# Patient Record
Sex: Female | Born: 1990 | Race: Black or African American | Hispanic: Yes | Marital: Single | Smoking: Never smoker
Health system: Southern US, Community
[De-identification: ages and names within clinical notes are randomized; demographics above are authoritative.]

## PROBLEM LIST (undated history)

## (undated) DIAGNOSIS — Z789 Other specified health status: Secondary | ICD-10-CM

## (undated) HISTORY — DX: Other specified health status: Z78.9

---

## 2017-07-25 ENCOUNTER — Other Ambulatory Visit: Payer: Self-pay

## 2017-07-25 ENCOUNTER — Encounter (HOSPITAL_COMMUNITY): Payer: Self-pay | Admitting: Emergency Medicine

## 2017-07-25 ENCOUNTER — Emergency Department (HOSPITAL_COMMUNITY)
Admission: EM | Admit: 2017-07-25 | Discharge: 2017-07-26 | Disposition: A | Discharge status: Home / Self Care | Payer: BLUE CROSS/BLUE SHIELD | Attending: Emergency Medicine | Admitting: Emergency Medicine

## 2017-07-25 DIAGNOSIS — A5901 Trichomonal vulvovaginitis: Secondary | ICD-10-CM | POA: Insufficient documentation

## 2017-07-25 DIAGNOSIS — R102 Pelvic and perineal pain: Secondary | ICD-10-CM | POA: Insufficient documentation

## 2017-07-25 DIAGNOSIS — N83202 Unspecified ovarian cyst, left side: Secondary | ICD-10-CM | POA: Diagnosis not present

## 2017-07-25 DIAGNOSIS — N946 Dysmenorrhea, unspecified: Secondary | ICD-10-CM | POA: Diagnosis not present

## 2017-07-25 DIAGNOSIS — N939 Abnormal uterine and vaginal bleeding, unspecified: Secondary | ICD-10-CM | POA: Diagnosis present

## 2017-07-25 LAB — CBC
HCT: 33.2 % — ABNORMAL LOW (ref 36.0–46.0)
Hemoglobin: 10.6 g/dL — ABNORMAL LOW (ref 12.0–15.0)
MCH: 25.3 pg — AB (ref 26.0–34.0)
MCHC: 31.9 g/dL (ref 30.0–36.0)
MCV: 79.2 fL (ref 78.0–100.0)
PLATELETS: 342 10*3/uL (ref 150–400)
RBC: 4.19 MIL/uL (ref 3.87–5.11)
RDW: 13.6 % (ref 11.5–15.5)
WBC: 4.3 10*3/uL (ref 4.0–10.5)

## 2017-07-25 LAB — I-STAT BETA HCG BLOOD, ED (MC, WL, AP ONLY)

## 2017-07-25 LAB — ABO/RH: ABO/RH(D): B POS

## 2017-07-25 LAB — HCG, QUANTITATIVE, PREGNANCY

## 2017-07-25 NOTE — ED Triage Notes (Signed)
Pt to ED for possible miscarriage - states she was due for her menstrual cycle yesterday, had initial spotting but then began having heavy blood clots. States she has gone through a 32-pack of pads since yesterday. No previous pregnancies. Had one large blood clot - showed to her GYN, who told her to go to the ED. Denies fevers, no dizziness.

## 2017-07-26 ENCOUNTER — Emergency Department (HOSPITAL_COMMUNITY): Payer: BLUE CROSS/BLUE SHIELD

## 2017-07-26 LAB — WET PREP, GENITAL
Clue Cells Wet Prep HPF POC: NONE SEEN
Sperm: NONE SEEN
WBC, Wet Prep HPF POC: NONE SEEN
Yeast Wet Prep HPF POC: NONE SEEN

## 2017-07-26 MED ORDER — NAPROXEN 500 MG PO TABS: 500.0000 mg | ORAL_TABLET | Freq: Two times a day (BID) | ORAL | 0 refills | Status: DC

## 2017-07-26 MED ORDER — METRONIDAZOLE 500 MG PO TABS: 500.0000 mg | ORAL_TABLET | Freq: Two times a day (BID) | ORAL | 0 refills | Status: DC

## 2017-07-26 MED ORDER — IBUPROFEN 400 MG PO TABS
600.0000 mg | ORAL_TABLET | Freq: Once | ORAL | Status: AC
  Administered 2017-07-26: 600 mg via ORAL
  Filled 2017-07-26: qty 1

## 2017-07-26 NOTE — ED Provider Notes (Signed)
[facility] EMERGENCY DEPARTMENT Provider Note   CSN: [id number] Arrival date & time: 07/25/17  2118     History   Chief Complaint Chief Complaint  Patient presents with  . Threatened Miscarriage    HPI Madison Jones is a 27 y.o. female with hx of anemia who presents to the ED for vaginal bleeding. Patient reports that she was dur for her period yesterday and only had spotting but then began bleeding heavy and passing clots today. She reports changing her pad 32 times since yesterday. Patient has never been pregnant before.  Patient came to the ED to see if she is having a miscarriage. Patient reports cramping in the lower abdomen. She denies fever or other problems.   HPI  History reviewed. No pertinent past medical history.  There are no active problems to display for this patient.   History reviewed. No pertinent surgical history.  OB History    Gravida Para Term Preterm AB Living   0 0 0 0 0 0   SAB TAB Ectopic Multiple Live Births   0 0 0 0 0       Home Medications    Prior to Admission medications   Not on File    Family History No family history on file.  Social History Social History   Tobacco Use  . Smoking status: Never Smoker  . Smokeless tobacco: Never Used  Substance Use Topics  . Alcohol use: Yes    Comment: occ  . Drug use: No     Allergies   Patient has no known allergies.   Review of Systems Review of Systems  Constitutional: Negative for chills and fever.  HENT: Negative.   Respiratory: Negative for shortness of breath.   Cardiovascular: Negative for chest pain.  Gastrointestinal: Positive for abdominal pain.  Genitourinary: Positive for menstrual problem and vaginal bleeding. Negative for dysuria, frequency and vaginal pain.  Musculoskeletal: Negative for back pain.  Skin: Negative for rash.  Neurological: Negative for weakness and headaches.  Psychiatric/Behavioral: Negative for confusion.      Physical Exam Updated Vital Signs BP 120/68 (BP Location: Right Arm)   Pulse (!) 105   Temp 98.6 F (37 C) (Oral)   Resp 18   Ht 5\' 2"  (1.575 m)   Wt 62.1 kg (137 lb)   LMP 07/24/2017 (Exact Date)   SpO2 100%   BMI 25.06 kg/m   Physical Exam  Constitutional: She is oriented to person, place, and time. She appears well-developed and well-nourished. No distress.  Eyes: EOM are normal.  Neck: Neck supple.  Cardiovascular: Tachycardia present.  Pulmonary/Chest: Effort normal.  Abdominal: Soft. There is tenderness.  Tender with palpation of lower abdomen. No guarding or rebound.  Genitourinary:  Genitourinary Comments: External genitalia without lesions, moderate blood vaginal vault. Frothy discharge, positive CMT, bilateral adnexal tenderness, uterus without enlargement.   Musculoskeletal: Normal range of motion.  Neurological: She is alert and oriented to person, place, and time.  Skin: Skin is warm and dry.  Psychiatric: She has a normal mood and affect.  Nursing note and vitals reviewed.    ED Treatments / Results  Labs (all labs ordered are listed, but only abnormal results are displayed) Labs Reviewed  WET PREP, GENITAL - Abnormal; Notable for the following components:      Result Value   Trich, Wet Prep PRESENT (*)    All other components within normal limits  CBC - Abnormal; Notable for the following components:   Hemoglobin  10.6 (*)    HCT 33.2 (*)    MCH 25.3 (*)    All other components within normal limits  HCG, QUANTITATIVE, PREGNANCY  I-STAT BETA HCG BLOOD, ED ([facility], [facility], [facility] ONLY)  ABO/RH  GC/CHLAMYDIA PROBE AMP ([place]) NOT AT [facility]    Radiology [place] Pelvic Complete W Transvaginal And Torsion R/o  Result Date: 07/26/2017 CLINICAL DATA:  Acute onset of pelvic pain and heavy vaginal bleeding. EXAM: TRANSABDOMINAL AND TRANSVAGINAL ULTRASOUND OF PELVIS TECHNIQUE: Both transabdominal and transvaginal ultrasound examinations of the pelvis were  performed. Transabdominal technique was performed for global imaging of the pelvis including uterus, ovaries, adnexal regions, and pelvic cul-de-sac. It was necessary to proceed with endovaginal exam following the transabdominal exam to visualize the uterus and ovaries in greater detail. COMPARISON:  None. FINDINGS: Uterus Measurements: 6.3 x 3.7 x 4.4 cm. No fibroids or other mass visualized. Endometrium Thickness: 0.5 cm.  No focal abnormality visualized. Right ovary Measurements: 2.8 x 1.6 x 2.8 cm. Normal appearance/no adnexal mass. Left ovary Measurements: 3.8 x 2.0 x 2.5 cm. A mildly complex 1.9 cm cyst at the left ovary demonstrates organization of internal echoes, suggestive of an organizing hemorrhagic cyst. Doppler evaluation of both ovaries demonstrates normal arterial and venous spectral waveforms with respect to both ovaries. Other findings Trace free fluid is seen within the pelvis. IMPRESSION: 1. Uterus unremarkable in appearance. No evidence for ovarian torsion. 2. Apparent small 1.9 cm organizing hemorrhagic cyst at the left ovary. Electronically Signed   By: Roanna RaiderJeffery  Chang M.D.   On: 07/26/2017 01:22    Procedures Procedures (including critical care time)  Medications Ordered in ED Medications - No data to display   Initial Impression / Assessment and Plan / ED Course  I have reviewed the triage vital signs and the nursing notes. 27 y.o. female here with heavy vaginal bleeding and lower abdominal pain stable for d/c without hemorrhage and no torsion noted on ultrasound. Patient does have a hemorrhagic cyst that measures 1.9 cm. And she does have trichomonas infection. Discussed results of labs and ultrasound with the patient and need for f/u and for partner treatment. Patient agrees with plan.   Final Clinical Impressions(s) / ED Diagnoses   Final diagnoses:  Pelvic pain  Trichomonas vaginitis    ED Discharge Orders    None       Kerrie Buffaloeese, Hope [place], [place] 07/26/17 0149     Doug SouJacubowitz, Sam, MD 07/28/17 1451

## 2017-07-26 NOTE — Discharge Instructions (Signed)
Follow up with your doctor for your cyst. The cyst is on the left ovary and measures 1.9 cm. Take the medication as directed for pain and inflammation. If you have additional GYN problems or your symptoms worsen [facility].

## 2017-07-26 NOTE — ED Notes (Signed)
Patient transported to Ultrasound 

## 2017-07-28 LAB — GC/CHLAMYDIA PROBE AMP (~~LOC~~) NOT AT ARMC
Chlamydia: NEGATIVE
NEISSERIA GONORRHEA: NEGATIVE

## 2019-06-04 IMAGING — US US PELV - US TRANSVAGINAL
1 series · 13 of 25 positions shown · non-contrast
Comparison: None.

CLINICAL DATA: Acute onset of pelvic pain and heavy vaginal
bleeding.

EXAM:
TRANSABDOMINAL AND TRANSVAGINAL ULTRASOUND OF PELVIS
TECHNIQUE: Both transabdominal and transvaginal ultrasound examinations of the
pelvis were performed. Transabdominal technique was performed for
global imaging of the pelvis including uterus, ovaries, adnexal
regions, and pelvic cul-de-sac. It was necessary to proceed with
endovaginal exam following the transabdominal exam to visualize the
uterus and ovaries in greater detail.

[Series 1: us pelv - us transvaginal · 0.21mm/px · 64 acquisitions, 13 frames shown]
[im 1/64]
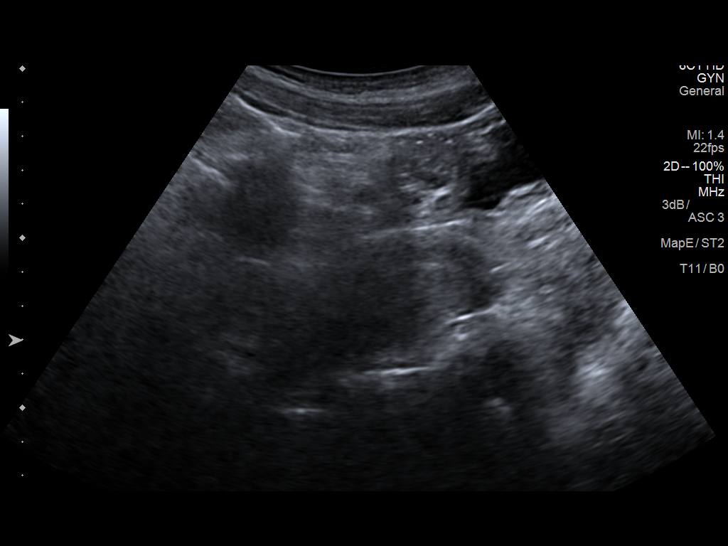
[im 6/64]
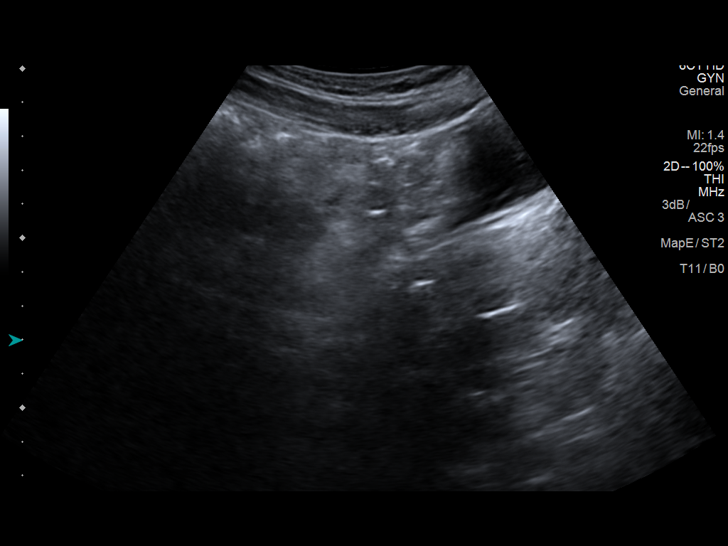
[im 11/64]
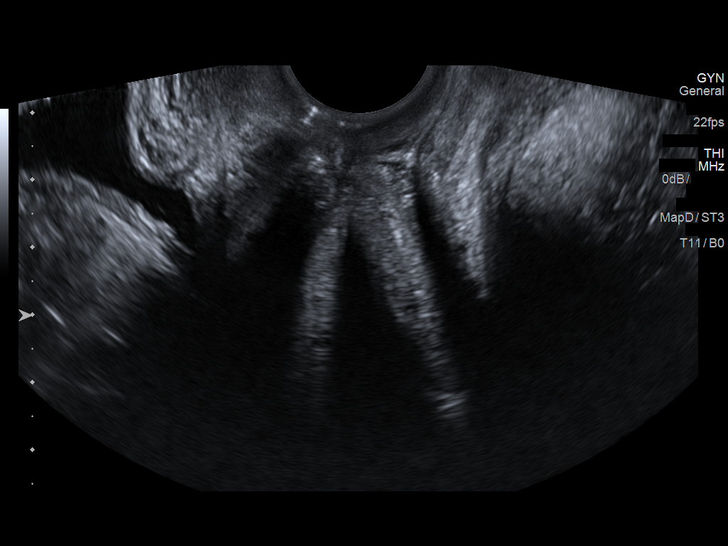
[im 16/64]
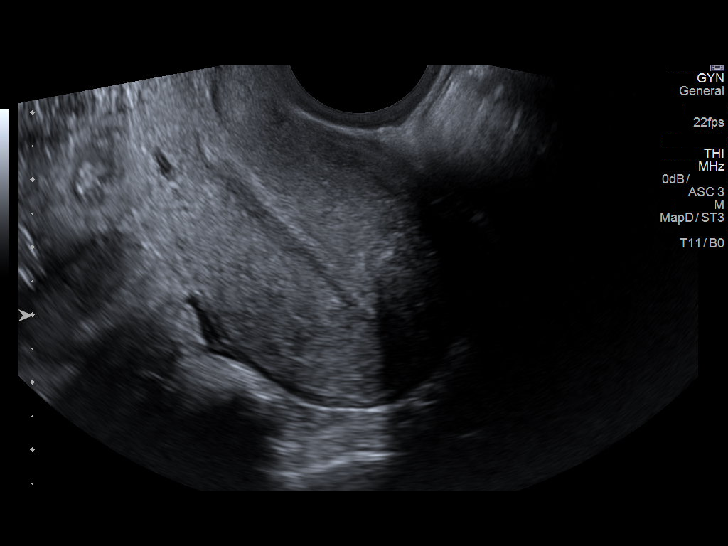
[im 22/64]
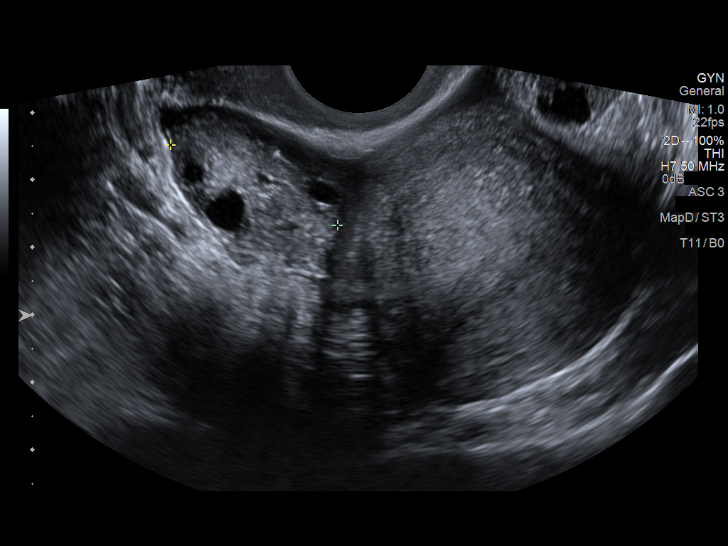
[im 27/64]
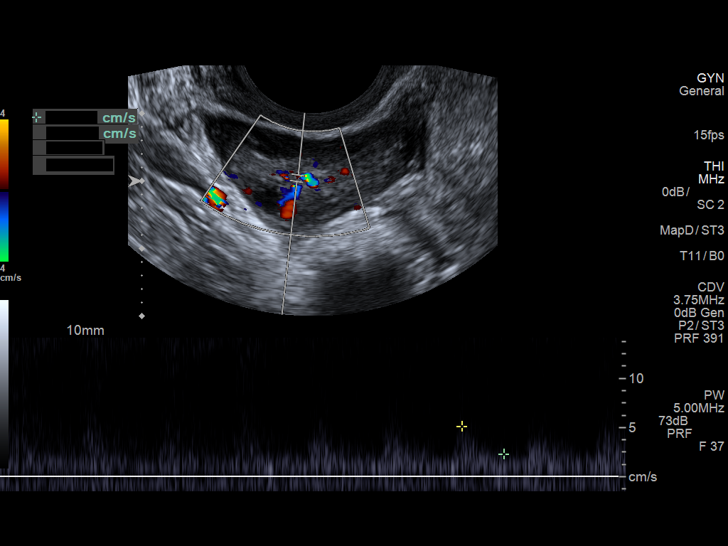
[im 32/64]
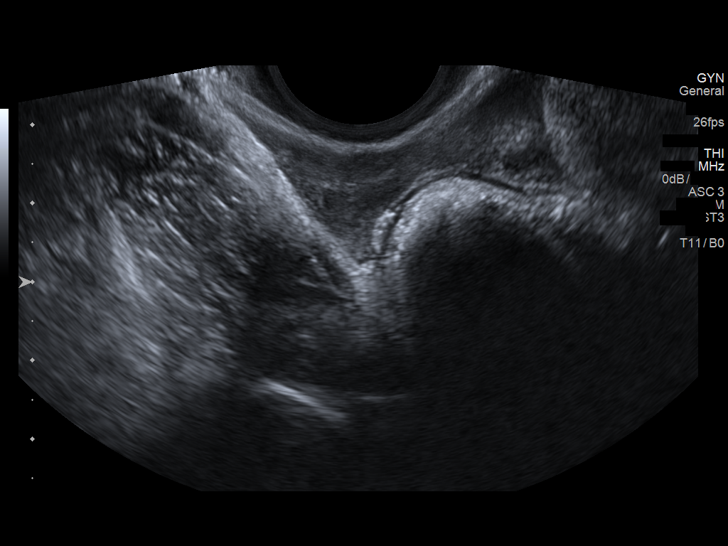
[im 37/64]
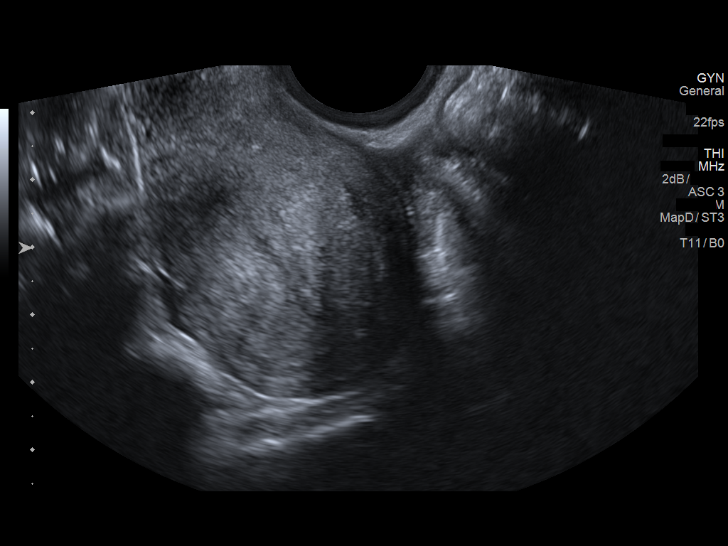
[im 43/64]
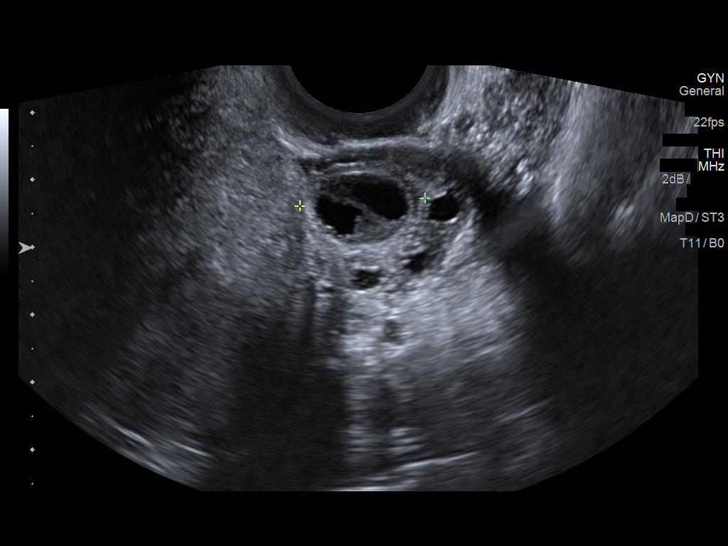
[im 48/64]
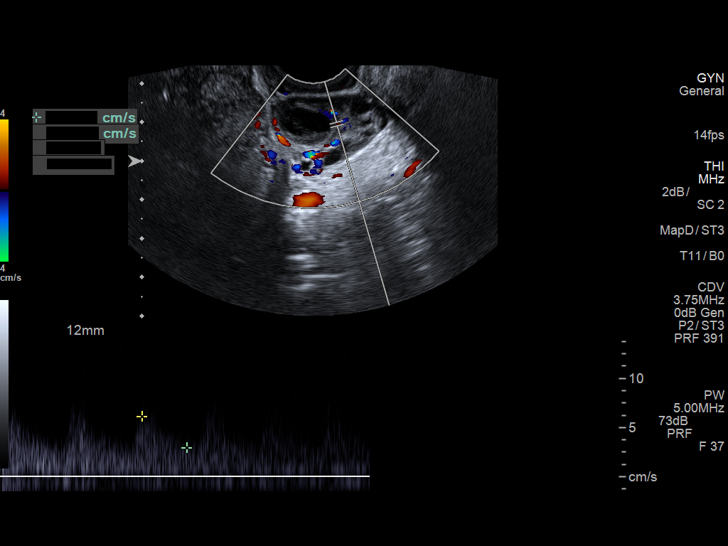
[im 53/64]
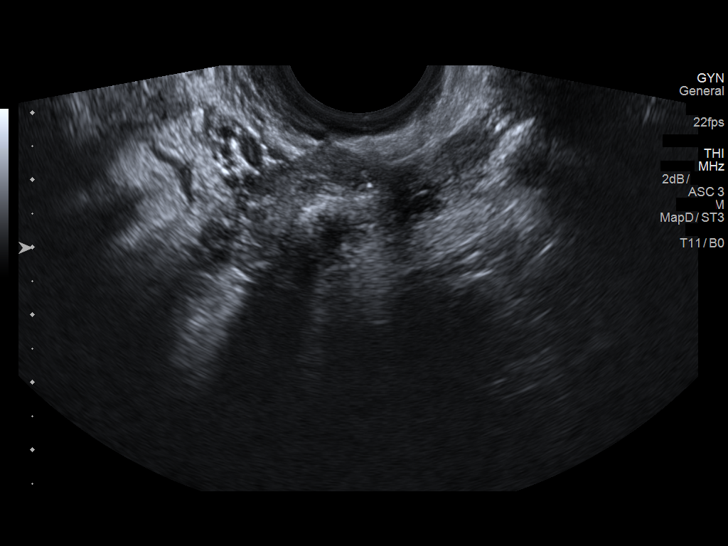
[im 58/64]
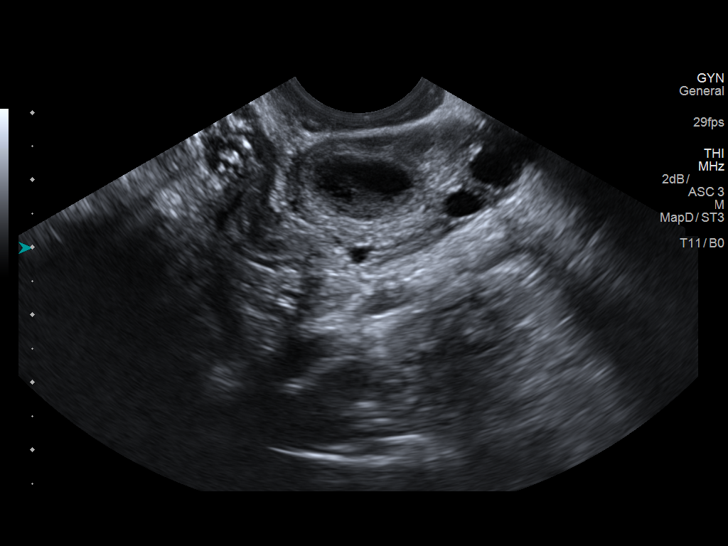
[im 64/64]
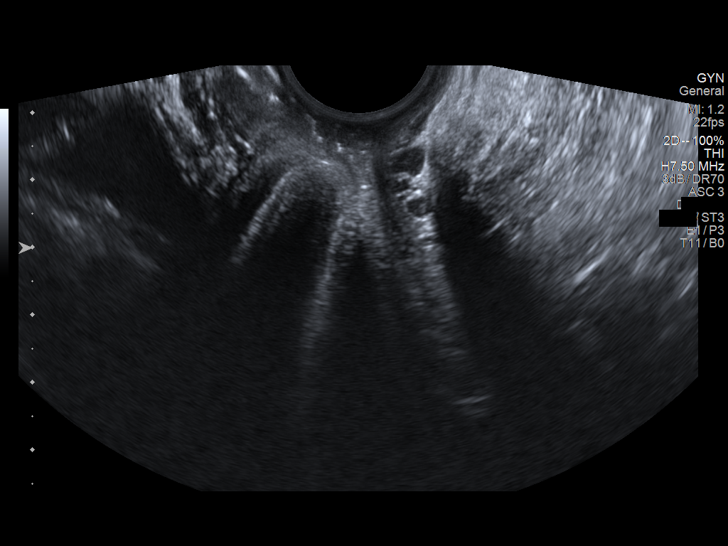

[13 of 25 positions shown; findings below may reference images not displayed]

FINDINGS: Uterus

Measurements: 6.3 x 3.7 x 4.4 cm. No fibroids or other mass
visualized.

Endometrium

Thickness: 0.5 cm.  No focal abnormality visualized.

Right ovary

Measurements: 2.8 x 1.6 x 2.8 cm. Normal appearance/no adnexal mass.

Left ovary

Measurements: 3.8 x 2.0 x 2.5 cm. A mildly complex 1.9 cm cyst at
the left ovary demonstrates organization of internal echoes,
suggestive of an organizing hemorrhagic cyst.

Doppler evaluation of both ovaries demonstrates normal arterial and
venous spectral waveforms with respect to both ovaries.

Other findings

Trace free fluid is seen within the pelvis.
IMPRESSION: 1. Uterus unremarkable in appearance. No evidence for ovarian
torsion.
2. Apparent small 1.9 cm organizing hemorrhagic cyst at the left
ovary.

## 2020-06-06 ENCOUNTER — Other Ambulatory Visit: Payer: Self-pay

## 2020-06-07 ENCOUNTER — Encounter: Payer: Self-pay | Admitting: Family Medicine

## 2020-06-07 ENCOUNTER — Ambulatory Visit: Payer: BC Managed Care – PPO | Admitting: Family Medicine

## 2020-06-07 ENCOUNTER — Other Ambulatory Visit (HOSPITAL_COMMUNITY)
Admission: RE | Admit: 2020-06-07 | Discharge: 2020-06-07 | Disposition: A | Discharge status: Home / Self Care | Payer: BC Managed Care – PPO | Source: Ambulatory Visit | Attending: Family Medicine | Admitting: Family Medicine

## 2020-06-07 VITALS — BP 111/71 | HR 71 | Temp 98.5°F | Ht 62.0 in | Wt 138.0 lb

## 2020-06-07 DIAGNOSIS — Z131 Encounter for screening for diabetes mellitus: Secondary | ICD-10-CM

## 2020-06-07 DIAGNOSIS — Z13220 Encounter for screening for lipoid disorders: Secondary | ICD-10-CM

## 2020-06-07 DIAGNOSIS — Z113 Encounter for screening for infections with a predominantly sexual mode of transmission: Secondary | ICD-10-CM | POA: Diagnosis not present

## 2020-06-07 DIAGNOSIS — Z23 Encounter for immunization: Secondary | ICD-10-CM

## 2020-06-07 DIAGNOSIS — Z130 Encounter for screening for diseases of the blood and blood-forming organs and certain disorders involving the immune mechanism: Secondary | ICD-10-CM | POA: Diagnosis not present

## 2020-06-07 DIAGNOSIS — Z114 Encounter for screening for human immunodeficiency virus [HIV]: Secondary | ICD-10-CM

## 2020-06-07 DIAGNOSIS — Z0000 Encounter for general adult medical examination without abnormal findings: Secondary | ICD-10-CM | POA: Diagnosis not present

## 2020-06-07 DIAGNOSIS — Z1159 Encounter for screening for other viral diseases: Secondary | ICD-10-CM

## 2020-06-07 DIAGNOSIS — Z7689 Persons encountering health services in other specified circumstances: Secondary | ICD-10-CM

## 2020-06-07 LAB — COMPREHENSIVE METABOLIC PANEL
ALT: 8 U/L (ref 0–35)
AST: 16 U/L (ref 0–37)
Albumin: 4.1 g/dL (ref 3.5–5.2)
Alkaline Phosphatase: 42 U/L (ref 39–117)
BUN: 7 mg/dL (ref 6–23)
CO2: 27 mEq/L (ref 19–32)
Calcium: 9.2 mg/dL (ref 8.4–10.5)
Chloride: 102 mEq/L (ref 96–112)
Creatinine, Ser: 0.75 mg/dL (ref 0.40–1.20)
GFR: 107.78 mL/min (ref >60.00)
Glucose, Bld: 73 mg/dL (ref 70–99)
Potassium: 3.8 mEq/L (ref 3.5–5.1)
Sodium: 135 mEq/L (ref 135–145)
Total Bilirubin: 0.7 mg/dL (ref 0.2–1.2)
Total Protein: 7.4 g/dL (ref 6.0–8.3)

## 2020-06-07 LAB — CBC WITH DIFFERENTIAL/PLATELET
Basophils Absolute: 0 10*3/uL (ref 0.0–0.1)
Basophils Relative: 1.1 % (ref 0.0–3.0)
Eosinophils Absolute: 0.1 10*3/uL (ref 0.0–0.7)
Eosinophils Relative: 1.4 % (ref 0.0–5.0)
HCT: 37.1 % (ref 36.0–46.0)
Hemoglobin: 12.1 g/dL (ref 12.0–15.0)
Lymphocytes Relative: 24.3 % (ref 12.0–46.0)
Lymphs Abs: 1 10*3/uL (ref 0.7–4.0)
MCHC: 32.7 g/dL (ref 30.0–36.0)
MCV: 80.5 fl (ref 78.0–100.0)
Monocytes Absolute: 0.2 10*3/uL (ref 0.1–1.0)
Monocytes Relative: 4.6 % (ref 3.0–12.0)
Neutro Abs: 2.9 10*3/uL (ref 1.4–7.7)
Neutrophils Relative %: 68.6 % (ref 43.0–77.0)
Platelets: 366 10*3/uL (ref 150.0–400.0)
RBC: 4.61 Mil/uL (ref 3.87–5.11)
RDW: 14.1 % (ref 11.5–15.5)
WBC: 4.2 10*3/uL (ref 4.0–10.5)

## 2020-06-07 LAB — LIPID PANEL
Cholesterol: 128 mg/dL (ref 0–200)
HDL: 61.8 mg/dL (ref >39.00)
LDL Cholesterol: 55 mg/dL (ref 0–99)
NonHDL: 66.63
Total CHOL/HDL Ratio: 2
Triglycerides: 60 mg/dL (ref 0.0–149.0)
VLDL: 12 mg/dL (ref 0.0–40.0)

## 2020-06-07 LAB — HEMOGLOBIN A1C: Hgb A1c MFr Bld: 5 % (ref 4.6–6.5)

## 2020-06-07 NOTE — Patient Instructions (Signed)
Health Maintenance, Female Adopting a healthy lifestyle and getting preventive care are important in promoting health and wellness. Ask your health care provider about:  The right schedule for you to have regular tests and exams.  Things you can do on your own to prevent diseases and keep yourself healthy. What should I know about diet, weight, and exercise? Eat a healthy diet  Eat a diet that includes plenty of vegetables, fruits, low-fat dairy products, and lean protein.  Do not eat a lot of foods that are high in solid fats, added sugars, or sodium.   Maintain a healthy weight Body mass index (BMI) is used to identify weight problems. It estimates body fat based on height and weight. Your health care provider can help determine your BMI and help you achieve or maintain a healthy weight. Get regular exercise Get regular exercise. This is one of the most important things you can do for your health. Most adults should:  Exercise for at least 150 minutes each week. The exercise should increase your heart rate and make you sweat (moderate-intensity exercise).  Do strengthening exercises at least twice a week. This is in addition to the moderate-intensity exercise.  Spend less time sitting. Even light physical activity can be beneficial. Watch cholesterol and blood lipids Have your blood tested for lipids and cholesterol at 30 years of age, then have this test every 5 years. Have your cholesterol levels checked more often if:  Your lipid or cholesterol levels are high.  You are older than 30 years of age.  You are at high risk for heart disease. What should I know about cancer screening? Depending on your health history and family history, you may need to have cancer screening at various ages. This may include screening for:  Breast cancer.  Cervical cancer.  Colorectal cancer.  Skin cancer.  Lung cancer. What should I know about heart disease, diabetes, and high blood  pressure? Blood pressure and heart disease  High blood pressure causes heart disease and increases the risk of stroke. This is more likely to develop in people who have high blood pressure readings, are of African descent, or are overweight.  Have your blood pressure checked: ? Every 3-5 years if you are 18-39 years of age. ? Every year if you are 40 years old or older. Diabetes Have regular diabetes screenings. This checks your fasting blood sugar level. Have the screening done:  Once every three years after age 40 if you are at a normal weight and have a low risk for diabetes.  More often and at a younger age if you are overweight or have a high risk for diabetes. What should I know about preventing infection? Hepatitis B If you have a higher risk for hepatitis B, you should be screened for this virus. Talk with your health care provider to find out if you are at risk for hepatitis B infection. Hepatitis C Testing is recommended for:  Everyone born from 1945 through 1965.  Anyone with known risk factors for hepatitis C. Sexually transmitted infections (STIs)  Get screened for STIs, including gonorrhea and chlamydia, if: ? You are sexually active and are younger than 30 years of age. ? You are older than 30 years of age and your health care provider tells you that you are at risk for this type of infection. ? Your sexual activity has changed since you were last screened, and you are at increased risk for chlamydia or gonorrhea. Ask your health care provider   if you are at risk.  Ask your health care provider about whether you are at high risk for HIV. Your health care provider may recommend a prescription medicine to help prevent HIV infection. If you choose to take medicine to prevent HIV, you should first get tested for HIV. You should then be tested every 3 months for as long as you are taking the medicine. Pregnancy  If you are about to stop having your period (premenopausal) and  you may become pregnant, seek counseling before you get pregnant.  Take 400 to 800 micrograms (mcg) of folic acid every day if you become pregnant.  Ask for birth control (contraception) if you want to prevent pregnancy. Osteoporosis and menopause Osteoporosis is a disease in which the bones lose minerals and strength with aging. This can result in bone fractures. If you are 65 years old or older, or if you are at risk for osteoporosis and fractures, ask your health care provider if you should:  Be screened for bone loss.  Take a calcium or vitamin D supplement to lower your risk of fractures.  Be given hormone replacement therapy (HRT) to treat symptoms of menopause. Follow these instructions at home: Lifestyle  Do not use any products that contain nicotine or tobacco, such as cigarettes, e-cigarettes, and chewing tobacco. If you need help quitting, ask your health care provider.  Do not use street drugs.  Do not share needles.  Ask your health care provider for help if you need support or information about quitting drugs. Alcohol use  Do not drink alcohol if: ? Your health care provider tells you not to drink. ? You are pregnant, may be pregnant, or are planning to become pregnant.  If you drink alcohol: ? Limit how much you use to 0-1 drink a day. ? Limit intake if you are breastfeeding.  Be aware of how much alcohol is in your drink. In the [place] one drink equals one 12 oz bottle of beer (355 mL), one 5 oz glass of wine (148 mL), or one 1 oz glass of hard liquor (44 mL). General instructions  Schedule regular health, dental, and eye exams.  Stay current with your vaccines.  Tell your health care provider if: ? You often feel depressed. ? You have ever been abused or do not feel safe at home. Summary  Adopting a healthy lifestyle and getting preventive care are important in promoting health and wellness.  Follow your health care provider's instructions about healthy  diet, exercising, and getting tested or screened for diseases.  Follow your health care provider's instructions on monitoring your cholesterol and blood pressure. This information is not intended to replace advice given to you by your health care provider. Make sure you discuss any questions you have with your health care provider. Document Revised: 04/22/2018 Document Reviewed: 04/22/2018 Elsevier Patient Education  2021 Elsevier Inc.  

## 2020-06-07 NOTE — Progress Notes (Signed)
Patient ID: Madison Jones, female  DOB: [birth year], 30 y.o.   MRN: [id number] Patient Care Team    Relationship Specialty Notifications Start End  Natalia Leatherwood, DO PCP - General Family Medicine  06/07/20     Chief Complaint  Patient presents with  . Annual Exam    CPE;     Subjective: Madison Jones is a 30 y.o.  female present for new patient establishment/CPE. All past medical history, surgical history, allergies, family history, immunizations, medications and social history were updated in the electronic medical record today. All recent labs, ED visits and hospitalizations within the last year were reviewed.  Health maintenance:  Colonoscopy:no fhx. Screen at 45 . Mammogram: no fhx- screen at 40 Cervical cancer screening: last pap: pt reports 2019, results: ?, completed by:PCP- wants to have here when due Immunizations: tdap UTD 2016, Influenza updated (encouraged yearly), covid series completed- booster- scheduling at work.  Infectious disease screening: HIV and Hep C and G/C cytology collected today per pt preference.  DEXA:routine screen at 60 Assistive device: none Oxygen IRW:ERXV Patient has a Dental home. Hospitalizations/ED visits:reviewed  Pt reports regular menstrual cycles- 28 days, with 5 days of light bleeding. She has unprotected sex with her female partner and would like STD screen today. She is not on any form of birth control.   Depression screen [facility] 2/9 06/07/2020  Decreased Interest 0  Down, Depressed, Hopeless 0  PHQ - 2 Score 0   No flowsheet data found.      No flowsheet data found.  Immunization History  Administered Date(s) Administered  . Hepatitis B 02/09/2002, 03/10/2002, 08/09/2002, 12/08/2014  . IPV 06/11/1991, 08/24/1991, 10/15/1991, 10/13/1992  . Influenza,inj,Quad PF,6+ Mos 06/07/2020  . PFIZER(Purple Top)SARS-COV-2 Vaccination 08/16/2019, 09/06/2019  . Td 07/13/2009  . Tdap 12/08/2014    No exam data present  History  reviewed. No pertinent past medical history. No Known Allergies History reviewed. No pertinent surgical history. Family History  Problem Relation Age of Onset  . Arthritis Mother   . Depression Mother   . Hypertension Mother   . Arthritis Father   . Depression Father   . Heart attack Father   . Hypertension Father   . Rheum arthritis Father   . Depression Sister   . Asthma Sister   . Learning disabilities Sister   . Arthritis Maternal Grandmother   . Asthma Maternal Grandmother   . Arthritis Maternal Grandfather   . Asthma Maternal Grandfather   . Skin cancer Maternal Grandfather   . Stroke Maternal Grandfather   . Rheum arthritis Paternal Grandmother   . Arthritis Paternal Grandmother   . Asthma Paternal Grandmother   . Cancer Paternal Grandfather   . Heart disease Sister    Social History   Social History Narrative   Marital status/children/pets: single   Education/employment: HS grad, HR- [employer]   Safety:      -Wears a bicycle helmet riding a bike: Yes     -smoke alarm in the home:Yes     - wears seatbelt: Yes     - Feels safe in their relationships: Yes    Allergies as of 06/07/2020   No Known Allergies     Medication List       Accurate as of June 07, 2020 11:29 AM. If you have any questions, ask your nurse or doctor.        STOP taking these medications   metroNIDAZOLE 500 MG tablet Commonly known as: FLAGYL Stopped by: Luster Landsberg  Ariona Deschene, DO   naproxen 500 MG tablet Commonly known as: NAPROSYN Stopped by: Felix Pacini, DO       All past medical history, surgical history, allergies, family history, immunizations andmedications were updated in the EMR today and reviewed under the history and medication portions of their EMR.    No results found for this or any previous visit (from the past 2160 hour(s)).    ROS: 14 pt review of systems performed and negative (unless mentioned in an HPI)  Objective: BP 111/71   Pulse 71   Temp 98.5 F (36.9  C) (Oral)   Ht 5\' 2"  (1.575 m)   Wt 138 lb (62.6 kg)   LMP 05/19/2020   SpO2 100%   BMI 25.24 kg/m  Gen: Afebrile. No acute distress. Nontoxic in appearance, well-developed, well-nourished,  Very pleasant female.  HENT: AT. [place]. Bilateral TM visualized and normal in appearance, normal external auditory canal. MMM, no oral lesions, adequate dentition. Bilateral nares within normal limits. Throat without erythema, ulcerations or exudates. no Cough on exam, no hoarseness on exam. Eyes:Pupils Equal Round Reactive to light, Extraocular movements intact,  Conjunctiva without redness, discharge or icterus. Neck/lymp/endocrine: Supple,no lymphadenopathy, no thyromegaly CV: RRR no murmur, no edema, +2/4 P posterior tibialis pulses.  Chest: CTAB, no wheeze, rhonchi or crackles. normal Respiratory effort. good Air movement. Abd: Soft. flat. NTND. BS present. no Masses palpated. No hepatosplenomegaly. No rebound tenderness or guarding. Skin: no rashes, purpura or petechiae. Warm and well-perfused. Skin intact. Neuro/Msk:  Normal gait. PERLA. EOMi. Alert. Oriented x3.  Cranial nerves II through XII intact. Muscle strength 5/5 upper/lower extremity. DTRs equal bilaterally. Psych: Normal affect, dress and demeanor. Normal speech. Normal thought content and judgment.   Assessment/plan: Madison Jones is a 30 y.o. female present for est care/CPE Establishing care with new doctor, encounter for Lipid screening - Lipid panel  Encounter for screening for HIV - HIV antibody (with reflex) Need for hepatitis C screening test - Hepatitis C Antibody  Diabetes mellitus screening - Comprehensive metabolic panel - Hemoglobin A1c  Screening for deficiency anemia - CBC with Differential/Platelet  Influenza vaccine needed - Flu Vaccine QUAD 6+ mos PF IM (Fluarix Quad PF)  Screen for STD (sexually transmitted disease) Asymptomatic- desired screen Encouraged condom use. - Urine cytology ancillary only([facility])  Routine general medical examination at a health care facility Patient was encouraged to exercise greater than 150 minutes a week. Patient was encouraged to choose a diet filled with fresh fruits and vegetables, and lean meats. AVS provided to patient today for education/recommendation on gender specific health and safety maintenance. Colonoscopy:no fhx. Screen at 45 . Mammogram: no fhx- screen at 40 Cervical cancer screening: last pap: pt reports 2019, results: ?, completed by:PCP- wants to have here when due Immunizations: tdap UTD 2016, Influenza updated (encouraged yearly), covid series completed- booster- scheduling at work.  Infectious disease screening: HIV and Hep C and G/C cytology collected today per pt preference.   Return in about 1 year (around 06/07/2021) for CPE (30 min).  Orders Placed This Encounter  Procedures  . Flu Vaccine QUAD 6+ mos PF IM (Fluarix Quad PF)  . CBC with Differential/Platelet  . Comprehensive metabolic panel  . Hemoglobin A1c  . Lipid panel  . HIV antibody (with reflex)  . Hepatitis C Antibody   No orders of the defined types were placed in this encounter.  Referral Orders  No referral(s) requested today     Note is dictated utilizing voice recognition  software. Although note has been proof read prior to signing, occasional typographical errors still can be missed. If any questions arise, please do not hesitate to call for verification.  Electronically signed by: Howard Pouch, DO [place]

## 2020-06-08 LAB — HEPATITIS C ANTIBODY
Hepatitis C Ab: NONREACTIVE
SIGNAL TO CUT-OFF: 0.02 (ref <1.00)

## 2020-06-08 LAB — URINE CYTOLOGY ANCILLARY ONLY
Chlamydia: NEGATIVE
Comment: NEGATIVE
Comment: NORMAL
Neisseria Gonorrhea: NEGATIVE

## 2020-06-08 LAB — HIV ANTIBODY (ROUTINE TESTING W REFLEX): HIV 1&2 Ab, 4th Generation: NONREACTIVE

## 2021-01-30 ENCOUNTER — Ambulatory Visit: Payer: BC Managed Care – PPO | Admitting: Family Medicine

## 2021-07-30 ENCOUNTER — Ambulatory Visit (INDEPENDENT_AMBULATORY_CARE_PROVIDER_SITE_OTHER): Payer: BC Managed Care – PPO | Admitting: Family Medicine

## 2021-07-30 ENCOUNTER — Encounter: Payer: Self-pay | Admitting: Family Medicine

## 2021-07-30 ENCOUNTER — Other Ambulatory Visit: Payer: Self-pay

## 2021-07-30 VITALS — BP 130/66 | HR 71 | Temp 98.3°F | Ht 62.25 in | Wt 154.0 lb

## 2021-07-30 DIAGNOSIS — Z0000 Encounter for general adult medical examination without abnormal findings: Secondary | ICD-10-CM

## 2021-07-30 DIAGNOSIS — N926 Irregular menstruation, unspecified: Secondary | ICD-10-CM | POA: Diagnosis not present

## 2021-07-30 DIAGNOSIS — Z130 Encounter for screening for diseases of the blood and blood-forming organs and certain disorders involving the immune mechanism: Secondary | ICD-10-CM

## 2021-07-30 DIAGNOSIS — Z131 Encounter for screening for diabetes mellitus: Secondary | ICD-10-CM

## 2021-07-30 DIAGNOSIS — Z13220 Encounter for screening for lipoid disorders: Secondary | ICD-10-CM

## 2021-07-30 LAB — CBC
HCT: 36.7 % (ref 36.0–46.0)
Hemoglobin: 12.1 g/dL (ref 12.0–15.0)
MCHC: 32.8 g/dL (ref 30.0–36.0)
MCV: 81.6 fl (ref 78.0–100.0)
Platelets: 312 10*3/uL (ref 150.0–400.0)
RBC: 4.5 Mil/uL (ref 3.87–5.11)
RDW: 15 % (ref 11.5–15.5)
WBC: 3.5 10*3/uL — ABNORMAL LOW (ref 4.0–10.5)

## 2021-07-30 LAB — COMPREHENSIVE METABOLIC PANEL
ALT: 9 U/L (ref 0–35)
AST: 17 U/L (ref 0–37)
Albumin: 3.7 g/dL (ref 3.5–5.2)
Alkaline Phosphatase: 39 U/L (ref 39–117)
BUN: 5 mg/dL — ABNORMAL LOW (ref 6–23)
CO2: 24 mEq/L (ref 19–32)
Calcium: 8.5 mg/dL (ref 8.4–10.5)
Chloride: 104 mEq/L (ref 96–112)
Creatinine, Ser: 0.71 mg/dL (ref 0.40–1.20)
GFR: 114.19 mL/min (ref >60.00)
Glucose, Bld: 83 mg/dL (ref 70–99)
Potassium: 3.9 mEq/L (ref 3.5–5.1)
Sodium: 134 mEq/L — ABNORMAL LOW (ref 135–145)
Total Bilirubin: 0.3 mg/dL (ref 0.2–1.2)
Total Protein: 6.5 g/dL (ref 6.0–8.3)

## 2021-07-30 LAB — HEMOGLOBIN A1C: Hgb A1c MFr Bld: 5.1 % (ref 4.6–6.5)

## 2021-07-30 LAB — LIPID PANEL
Cholesterol: 116 mg/dL (ref 0–200)
HDL: 56.8 mg/dL (ref >39.00)
LDL Cholesterol: 42 mg/dL (ref 0–99)
NonHDL: 59.01
Total CHOL/HDL Ratio: 2
Triglycerides: 86 mg/dL (ref 0.0–149.0)
VLDL: 17.2 mg/dL (ref 0.0–40.0)

## 2021-07-30 LAB — TSH: TSH: 2.74 u[IU]/mL (ref 0.35–5.50)

## 2021-07-30 NOTE — Patient Instructions (Signed)

## 2021-07-30 NOTE — Progress Notes (Signed)
? ?This visit occurred during the SARS-CoV-2 public health emergency.  Safety protocols were in place, including screening questions prior to the visit, additional usage of staff PPE, and extensive cleaning of exam room while observing appropriate contact time as indicated for disinfecting solutions.  ? ? ?Patient ID: Madison Jones, female  DOB: [birth year], 31 y.o.   MRN: [id number] ?Patient Care Team  ?  Relationship Specialty Notifications Start End  ?Natalia Leatherwood, DO PCP - General Family Medicine  06/07/20   ? ? ?Chief Complaint  ?Patient presents with  ? Annual Exam  ?  Pt is fasting  ? ? ?Subjective: ?Madison Jones is a 31 y.o.  Female  present for CPE  ?All past medical history, surgical history, allergies, family history, immunizations, medications and social history were updated in the electronic medical record today. ?All recent labs, ED visits and hospitalizations within the last year were reviewed. ? ?Health maintenance:  ?Colonoscopy:no fhx. Screen at 45 . ?Mammogram: no fhx- screen at 40 ?Cervical cancer screening: last pap: pt reports 2019, results: ?, completed by:PCP- wants to have here when due ?Immunizations: tdap UTD 2016, Influenza updated (encouraged yearly), covid series completed- booster- scheduling at work.  ?Infectious disease screening: HIV and Hep C and G/C UTD ?DEXA:routine screen at 60 ?Patient has a Dental home. ?Hospitalizations/ED visits: reviewed ? ?Menstrual changes: pt reports normal bowel and bladder habits. Her cycle has become shorter. Use to be 7 days now about 4 and light. She is working out more in [employer] and has changed her eating habits.  ? ?Depression screen [facility] 2/9 07/30/2021 06/07/2020  ?Decreased Interest 0 0  ?Down, Depressed, Hopeless 0 0  ?PHQ - 2 Score 0 0  ? ?No flowsheet data found. ? ? ?Immunization History  ?Administered Date(s) Administered  ? Hepatitis B 02/09/2002, 03/10/2002, 08/09/2002, 12/08/2014  ? IPV 06/11/1991, 08/24/1991, 10/15/1991, 10/13/1992  ?  Influenza,inj,Quad PF,6+ Mos 06/07/2020  ? PFIZER(Purple Top)SARS-COV-2 Vaccination 08/16/2019, 09/06/2019  ? Td 07/13/2009, 01/05/2021  ? Tdap 12/08/2014  ? ? ?History reviewed. No pertinent past medical history. ?No Known Allergies ?History reviewed. No pertinent surgical history. ?Family History  ?Problem Relation Age of Onset  ? Arthritis Mother   ? Depression Mother   ? Hypertension Mother   ? Arthritis Father   ? Depression Father   ? Heart attack Father   ? Hypertension Father   ? Rheum arthritis Father   ? Depression Sister   ? Asthma Sister   ? Learning disabilities Sister   ? Arthritis Maternal Grandmother   ? Asthma Maternal Grandmother   ? Arthritis Maternal Grandfather   ? Asthma Maternal Grandfather   ? Skin cancer Maternal Grandfather   ? Stroke Maternal Grandfather   ? Rheum arthritis Paternal Grandmother   ? Arthritis Paternal Grandmother   ? Asthma Paternal Grandmother   ? Cancer Paternal Grandfather   ? Heart disease Sister   ? ?Social History  ? ?Social History Narrative  ? Marital status/children/pets: single  ? 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 Safety:   ?   -Wears a bicycle helmet riding a bike: Yes  ?   -smoke alarm in the home:Yes  ?   - wears seatbelt: Yes  ?   - Feels safe in their relationships: Yes  ? ? ?Allergies as of 07/30/2021   ?No Known Allergies ?  ? ?  ?Medication List  ?  ?as of July 30, 2021  9:24 AM ?  ?You have not been prescribed  any medications. ?  ? ? ?All past medical history, surgical history, allergies, family history, immunizations andmedications were updated in the EMR today and reviewed under the history and medication portions of their EMR.    ? ?No results found for this or any previous visit (from the past 2160 hour(s)). ? ?No results found. ? ? ?ROS ?14 pt review of systems performed and negative (unless mentioned in an HPI) ?Objective: ?BP 130/66   Pulse 71   Temp 98.3 ?F (36.8 ?C) (Oral)   Ht 5' 2.25" (1.581 m)   Wt 154 lb (69.9 kg)   LMP  07/10/2021   SpO2 100%   BMI 27.94 kg/m?  ?Physical Exam ?Vitals and nursing note reviewed.  ?Constitutional:   ?   General: She is not in acute distress. ?   Appearance: Normal appearance. She is not ill-appearing or toxic-appearing.  ?HENT:  ?   Head: Normocephalic and atraumatic.  ?   Right Ear: Tympanic membrane, ear canal and external ear normal. There is no impacted cerumen.  ?   Left Ear: Tympanic membrane, ear canal and external ear normal. There is no impacted cerumen.  ?   Nose: No congestion or rhinorrhea.  ?   Mouth/Throat:  ?   Mouth: Mucous membranes are moist.  ?   Pharynx: Oropharynx is clear. No oropharyngeal exudate or posterior oropharyngeal erythema.  ?Eyes:  ?   General: No scleral icterus.    ?   Right eye: No discharge.     ?   Left eye: No discharge.  ?   Extraocular Movements: Extraocular movements intact.  ?   Conjunctiva/sclera: Conjunctivae normal.  ?   Pupils: Pupils are equal, round, and reactive to light.  ?Cardiovascular:  ?   Rate and Rhythm: Normal rate and regular rhythm.  ?   Pulses: Normal pulses.  ?   Heart sounds: Normal heart sounds. No murmur heard. ?  No friction rub. No gallop.  ?Pulmonary:  ?   Effort: Pulmonary effort is normal. No respiratory distress.  ?   Breath sounds: Normal breath sounds. No stridor. No wheezing, rhonchi or rales.  ?Chest:  ?   Chest wall: No tenderness.  ?Abdominal:  ?   General: Abdomen is flat. Bowel sounds are normal. There is no distension.  ?   Palpations: Abdomen is soft. There is no mass.  ?   Tenderness: There is no abdominal tenderness. There is no right CVA tenderness, left CVA tenderness, guarding or rebound.  ?   Hernia: No hernia is present.  ?Musculoskeletal:     ?   General: No swelling, tenderness or deformity. Normal range of motion.  ?   Cervical back: Normal range of motion and neck supple. No rigidity or tenderness.  ?   Right lower leg: No edema.  ?   Left lower leg: No edema.  ?Lymphadenopathy:  ?   Cervical: No cervical  adenopathy.  ?Skin: ?   General: Skin is warm and dry.  ?   Coloration: Skin is not jaundiced or pale.  ?   Findings: No bruising, erythema, lesion or rash.  ?Neurological:  ?   General: No focal deficit present.  ?   Mental Status: She is alert and oriented to person, place, and time. Mental status is at baseline.  ?   Cranial Nerves: No cranial nerve deficit.  ?   Sensory: No sensory deficit.  ?   Motor: No weakness.  ?   Coordination: Coordination normal.  ?  Gait: Gait normal.  ?   Deep Tendon Reflexes: Reflexes normal.  ?Psychiatric:     ?   Mood and Affect: Mood normal.     ?   Behavior: Behavior normal.     ?   Thought Content: Thought content normal.     ?   Judgment: Judgment normal.  ?  ? ?No results found. ? ?Assessment/plan: ?Madison Jones is a 31 y.o. female present for CPE  ?Screening for deficiency anemia ?- CBC ?Diabetes mellitus screening ?- Comprehensive metabolic panel ?- Hemoglobin A1c ?Lipid screening ?- Lipid panel ?Menstrual changes ?- TSH ? ?Routine general medical examination at a health care facility ?Colonoscopy:no fhx. Screen at 45 . ?Mammogram: no fhx- screen at 40 ?Cervical cancer screening: last pap: pt reports 2019, results: ?, completed by:PCP- wants to have here when due ?Immunizations: tdap UTD 2016, Influenza updated (encouraged yearly), covid series completed- booster- scheduling at work.  ?Infectious disease screening: HIV and Hep C and G/C UTD ?DEXA:routine screen at 60 ?Patient was encouraged to exercise greater than 150 minutes a week. Patient was encouraged to choose a diet filled with fresh fruits and vegetables, and lean meats. ?AVS provided to patient today for education/recommendation on gender specific health and safety maintenance. ? ?Return in about 3 months (around 10/30/2021) for Pap. ? ? ?Orders Placed This Encounter  ?Procedures  ? CBC  ? Comprehensive metabolic panel  ? Hemoglobin A1c  ? Lipid panel  ? TSH  ? ?No orders of the defined types were placed in this  encounter. ? ?Referral Orders  ?No referral(s) requested today  ? ? ? ?Electronically signed by: ?Felix Pacini, DO ?[facility] ?

## 2021-10-30 ENCOUNTER — Encounter: Payer: Self-pay | Admitting: Family Medicine

## 2021-10-30 ENCOUNTER — Ambulatory Visit: Payer: BC Managed Care – PPO | Admitting: Family Medicine

## 2021-10-30 ENCOUNTER — Other Ambulatory Visit (HOSPITAL_COMMUNITY)
Admission: RE | Admit: 2021-10-30 | Discharge: 2021-10-30 | Disposition: A | Discharge status: Home / Self Care | Payer: BC Managed Care – PPO | Source: Ambulatory Visit | Attending: Family Medicine | Admitting: Family Medicine

## 2021-10-30 VITALS — BP 120/69 | HR 73 | Temp 98.5°F | Ht 62.0 in | Wt 150.0 lb

## 2021-10-30 DIAGNOSIS — Z113 Encounter for screening for infections with a predominantly sexual mode of transmission: Secondary | ICD-10-CM | POA: Diagnosis not present

## 2021-10-30 DIAGNOSIS — R109 Unspecified abdominal pain: Secondary | ICD-10-CM

## 2021-10-30 DIAGNOSIS — Z01419 Encounter for gynecological examination (general) (routine) without abnormal findings: Secondary | ICD-10-CM | POA: Insufficient documentation

## 2021-10-30 NOTE — Progress Notes (Signed)
Madison Jones , May 02, 1991, 31 y.o., female MRN: [id number] Patient Care Team    Relationship Specialty Notifications Start End  Natalia Leatherwood, DO PCP - General Family Medicine  06/07/20     Chief Complaint  Patient presents with   Gynecologic Exam     Subjective: Pt presents for an OV with desire of STD screen and sue for PAP.  She reports she is in a monogamous relationship with one female partner, but would like STD screen since he is a fairly new partner. She denies vaginal discharge, dyspareunia or lesions. Patient's last menstrual period was 10/01/2021. She has had abd cramping last few days and thinks her cycle is coming today.       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 No pertinent past medical history. History reviewed. No pertinent surgical history. Family History  Problem Relation Age of Onset   Arthritis Mother    Depression Mother    Hypertension Mother    Arthritis Father    Depression Father    Heart attack Father    Hypertension Father    Rheum arthritis Father    Depression Sister    Asthma Sister    Learning disabilities Sister    Arthritis Maternal Grandmother    Asthma Maternal Grandmother    Arthritis Maternal Grandfather    Asthma Maternal Grandfather    Skin cancer Maternal Grandfather    Stroke Maternal Grandfather    Rheum arthritis Paternal Grandmother    Arthritis Paternal Grandmother    Asthma Paternal Grandmother    Cancer Paternal Grandfather    Heart disease Sister    Allergies as of  10/30/2021   No Known Allergies      Medication List    as of October 30, 2021  9:54 AM   You have not been prescribed any medications.     All past medical history, surgical history, allergies, family history, immunizations andmedications were updated in the EMR today and reviewed under the history and medication portions of their EMR.     ROS Negative, with the exception of above mentioned in HPI   Objective:  BP 120/69   Pulse 73   Temp 98.5 F (36.9 C) (Oral)   Ht 5\' 2"  (1.575 m)   Wt 150 lb (68 kg)   LMP 10/01/2021   SpO2 99%   BMI 27.44 kg/m  Body mass index is 27.44 kg/m. Physical Exam Vitals and nursing note reviewed. Exam conducted with a chaperone present.  Constitutional:      General: She is not in acute distress.    Appearance: Normal appearance. She is normal weight. She is not ill-appearing or toxic-appearing.  Eyes:     Extraocular Movements: Extraocular movements intact.     Conjunctiva/sclera: Conjunctivae normal.     Pupils: Pupils are equal, round, and reactive to light.  Chest:  Breasts:  Breasts are symmetrical.     Right: Normal. No swelling, bleeding, inverted nipple, mass, nipple discharge, skin change or tenderness.     Left: Normal. No swelling, bleeding, inverted nipple, mass, nipple discharge, skin change or tenderness.  Genitourinary:    General: Normal vulva.     Exam position: Lithotomy position.     Pubic Area: No rash.      Labia:        Right: No rash, tenderness, lesion or injury.        Left: No rash, tenderness, lesion or injury.      Urethra: No prolapse, urethral pain, urethral swelling or urethral lesion.     Vagina: Normal.     Cervix: Cervical bleeding present.     Uterus: Normal.      Adnexa: Right adnexa normal and left adnexa normal.     Rectum: Normal.  Neurological:     Mental Status: She is alert and oriented to person, place, and time. Mental status is at baseline.  Psychiatric:        Mood and Affect: Mood  normal.        Behavior: Behavior normal.        Thought Content: Thought content normal.        Judgment: Judgment normal.     No results found. No results found. No results found for this or any previous visit (from the past 24 hour(s)).  Assessment/Plan: Madison Jones is a 31 y.o. female present for OV for  Encounter for routine gynecological examination with Papanicolaou smear of cervix - Cytology - PAP( [place]) with hpv testing Abd cramping: Small amount of BRB at cervical OS- likely menstrual related Screen for STD (sexually transmitted disease) Pt desires STD panel screen.  - Cytology - PAP( [place]) - HIV antibody (with reflex) - Hepatitis C Antibody - RPR - HSV 2 antibody, IgG  Reviewed expectations re: course of current medical issues. Discussed self-management of symptoms. Outlined signs and symptoms indicating need for more acute intervention. Patient verbalized understanding and all questions were answered. Patient received an After-Visit Summary.    Orders Placed This Encounter  Procedures   HIV antibody (with reflex)   Hepatitis C Antibody   RPR   HSV 2 antibody, IgG   No orders of the defined types were placed in this encounter.  Referral Orders  No referral(s) requested today     Note is dictated utilizing voice recognition software. Although note has been proof read prior to signing, occasional typographical errors still can be missed. If any questions arise, please do not hesitate to call for verification.   electronically signed by:  Felix Pacini, DO  [facility] [place]

## 2021-10-30 NOTE — Patient Instructions (Signed)
No follow-ups on file.        Great to see you today.  I have refilled the medication(s) we provide.   If labs were collected, we will inform you of lab results once received either by echart message or telephone call.   - echart message- for normal results that have been seen by the patient already.   - telephone call: abnormal results or if patient has not viewed results in their echart.  

## 2021-10-31 LAB — HIV ANTIBODY (ROUTINE TESTING W REFLEX): HIV 1&2 Ab, 4th Generation: NONREACTIVE

## 2021-10-31 LAB — HEPATITIS C ANTIBODY: Hepatitis C Ab: NONREACTIVE

## 2021-10-31 LAB — HSV 2 ANTIBODY, IGG: HSV 2 Glycoprotein G Ab, IgG: 15.5 index — ABNORMAL HIGH

## 2021-10-31 LAB — RPR: RPR Ser Ql: NONREACTIVE

## 2021-11-01 ENCOUNTER — Telehealth: Payer: Self-pay | Admitting: Family Medicine

## 2021-11-01 ENCOUNTER — Encounter: Payer: Self-pay | Admitting: Family Medicine

## 2021-11-01 DIAGNOSIS — Z8619 Personal history of other infectious and parasitic diseases: Secondary | ICD-10-CM | POA: Insufficient documentation

## 2021-11-01 MED ORDER — VALACYCLOVIR HCL 500 MG PO TABS: 500.0000 mg | ORAL_TABLET | Freq: Every day | ORAL | 3 refills | Status: DC

## 2021-11-01 NOTE — Telephone Encounter (Signed)
Called Patient and discussed test results with her today. Hepatitis C, HIV, syphilis labs are all negative. 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 Pap smear results are still pending and this takes about a week or more to return.  We discussed positive result in detail today including suppressive therapy.  Patient would like to start Valtrex 500 mg daily.

## 2021-11-05 ENCOUNTER — Telehealth: Payer: Self-pay | Admitting: Family Medicine

## 2021-11-05 DIAGNOSIS — R87810 Cervical high risk human papillomavirus (HPV) DNA test positive: Secondary | ICD-10-CM

## 2021-11-05 LAB — CYTOLOGY - PAP
Chlamydia: NEGATIVE
Comment: NEGATIVE
Comment: NEGATIVE
Comment: NEGATIVE
Comment: NEGATIVE
Comment: NEGATIVE
Comment: NEGATIVE
Comment: NORMAL
Diagnosis: NEGATIVE
HPV 16: NEGATIVE
HPV 18 / 45: NEGATIVE
HSV1: NEGATIVE
HSV2: NEGATIVE
High risk HPV: POSITIVE — AB
Neisseria Gonorrhea: NEGATIVE
Trichomonas: NEGATIVE

## 2021-11-06 NOTE — Telephone Encounter (Signed)
Spoke with pt regarding labs and instructions.   

## 2022-01-31 ENCOUNTER — Encounter: Payer: Self-pay | Admitting: Family Medicine

## 2023-02-28 ENCOUNTER — Encounter: Payer: BC Managed Care – PPO | Admitting: Family Medicine

## 2023-10-03 ENCOUNTER — Telehealth: Payer: Self-pay

## 2023-10-03 NOTE — Telephone Encounter (Signed)
 LVM2CB pt needs to be rescheduled for the first physical slot (appears to be 06/11 in the afternoon)

## 2023-10-20 ENCOUNTER — Ambulatory Visit: Admitting: Family Medicine

## 2023-12-22 NOTE — Progress Notes (Signed)
 New OB Intake  I connected with Madison Jones  on 12/22/23 at  9:15 AM EDT by MyChart Video Visit and verified that I am speaking with the correct person using two identifiers. Nurse is located at [facility] and pt is located at home.  I discussed the limitations, risks, security and privacy concerns of performing an evaluation and management service by telephone and the availability of in person appointments. I also discussed with the patient that there may be a patient responsible charge related to this service. The patient expressed understanding and agreed to proceed.  I explained I am completing New OB Intake today. We discussed EDD of 07/09/2024 based on LMP of 10/03/2023. Pt is [id number]. I reviewed her allergies, medications and Medical/Surgical/OB history.    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. Discussed the nature of our practice with multiple providers including residents and students as well as female and female providers. Due to the size of the practice, the delivering provider may not be the same as those providing prenatal care.   Patient is interested in water birth.  MyChart/Babyscripts MyChart access verified. I explained pt will have some visits in office and some virtually. Babyscripts instructions given and order placed. Patient verifies receipt of registration text/e-mail. Account successfully created and app downloaded. If patient is a candidate for Optimized scheduling, add to sticky note.   Blood Pressure Cuff/Weight Scale Patient has private insurance; instructed to purchase blood pressure cuff and bring to first prenatal appt. Explained after first prenatal appt pt will check weekly and document in Babyscripts.   Anatomy US  Explained  first scheduled US  will be around 19 weeks. Anatomy US  scheduled for 03/02/2024 at 7:30am.  Is patient a CenteringPregnancy candidate?  Declined Declined due to Enrolled in [facility]  Is patient a Mom+Baby Combined Care candidate?  Accepted   If accepted, confirm patient does not intend to move from the area for at least 12 months, then notify Mom+Baby staff  First visit review I reviewed new OB appt with patient. Explained pt will be seen by Dr. Cresenzo at first visit. Discussed [name] genetic screening with patient. [facility] and [facility].. Routine prenatal labs is needed at new ob Appointment.  Last Pap Diagnosis  Date Value Ref Range Status  10/30/2021   Final   - Negative for intraepithelial lesion or malignancy (NILM)    Madison Jones, CMA 12/22/2023  4:38 PM

## 2023-12-23 ENCOUNTER — Telehealth

## 2023-12-23 DIAGNOSIS — Z3491 Encounter for supervision of normal pregnancy, unspecified, first trimester: Secondary | ICD-10-CM | POA: Diagnosis not present

## 2023-12-23 DIAGNOSIS — Z3A13 13 weeks gestation of pregnancy: Secondary | ICD-10-CM

## 2023-12-23 DIAGNOSIS — Z3490 Encounter for supervision of normal pregnancy, unspecified, unspecified trimester: Secondary | ICD-10-CM | POA: Insufficient documentation

## 2023-12-23 NOTE — Patient Instructions (Signed)
 Safe Medications in Pregnancy   Acne:  Benzoyl Peroxide  Salicylic Acid   Backache/Headache:  Tylenol: 2 regular strength every 4 hours OR               2 Extra strength every 6 hours   Colds/Coughs/Allergies:  Benadryl (alcohol free) 25 mg every 6 hours as needed  Breath right strips  Claritin  Cepacol throat lozenges  Chloraseptic throat spray  Cold-Eeze- up to three times per day  Cough drops, alcohol free  Flonase (by prescription only)  Guaifenesin  Mucinex  Robitussin DM (plain only, alcohol free)  Saline nasal spray/drops  Sudafed (pseudoephedrine) & Actifed * use only after [redacted] weeks gestation and if you do not have high blood pressure  Tylenol  Vicks Vaporub  Zinc lozenges  Zyrtec   Constipation:  Colace  Ducolax suppositories  Fleet enema  Glycerin suppositories  Metamucil  Milk of magnesia  Miralax  Senokot  Smooth move tea   Diarrhea:  Kaopectate  Imodium A-D   *NO pepto Bismol   Hemorrhoids:  Anusol  Anusol HC  Preparation H  Tucks   Indigestion:  Tums  Maalox  Mylanta  Zantac  Pepcid   Insomnia:  Benadryl (alcohol free) 25mg  every 6 hours as needed  Tylenol PM  Unisom, no Gelcaps   Leg Cramps:  Tums  MagGel   Nausea/Vomiting:  Bonine  Dramamine  Emetrol  Ginger extract  Sea bands  Meclizine  Nausea medication to take during pregnancy:  Unisom (doxylamine succinate 25 mg tablets) Take one tablet daily at bedtime. If symptoms are not adequately controlled, the dose can be increased to a maximum recommended dose of two tablets daily (1/2 tablet in the morning, 1/2 tablet mid-afternoon and one at bedtime).  Vitamin B6 100mg  tablets. Take one tablet twice a day (up to 200 mg per day).   Skin Rashes:  Aveeno products  Benadryl cream or 25mg  every 6 hours as needed  Calamine Lotion  1% cortisone cream   Yeast infection:  Gyne-lotrimin 7  Monistat 7    **If taking multiple medications, please check labels to avoid  duplicating the same active ingredients  **take medication as directed on the label  ** Do not exceed 4000 mg of tylenol in 24 hours  **Do not take medications that contain aspirin or ibuprofen            [facility] Pediatric Providers  [place] ([address]) [facility] Manson Passey, MD; Deirdre Priest, MD; Lum Babe, MD; Leveda Anna, MD; McDiarmid, MD; Jerene Bears, MD [address] [place] [place], [place] [address] [phone] Mon-Fri 8:30-12:30, 1:30-5:00  Providers come to see babies during newborn hospitalization Only accepting infants of Mother's who are seen at [facility] or have siblings seen at   [facility] Medicaid - Yes; Tricare - Yes   [facility], MD [address] [place], [place] [address] [phone] Mon, Tue, Thur, Fri 8:30-5:00, Wed 10:00-7:00 (closed 1-2pm daily for lunch) [facility] residents with no insurance.  [place] [employer] only with Medicaid/insurance; Tricare - no  [facility]) - Tim and [facility], MD; Manson Passey, MD; Ave Filter, MD; Luna Fuse, MD; Kennedy Bucker, MD; Florestine Avers, MD; Melchor Amour, MD; Yetta Barre,  MD; Konrad Dolores, MD; Kathlene November, MD; Jenne Campus, MD; Wynetta Emery, MD; Duffy Rhody, MD; Gerre Couch, NP [address] [place], [place], [place] [address] [phone] Mon, Tue, Thur, Fri 8:30-5:30, Wed 9:30-5:30, Sat 8:30-12:30 Only accepting infants of first-time parents or siblings of current  patients Hospital discharge coordinator will make follow-up appointment Medicaid - yes; Tricare - yes  [place] ([address]) [facility], MD; Earlene Plater, MD; Jamesetta Orleans, MD; Alvera Novel, MD; Rana Snare, MD; [facility], MD; De Graff, MD; Hosie Poisson, MD; Mayford Knife, MD [address], [place], [place] [address] [phone] Mon-Fri 8:30-5:00, closed for lunch 12:30-1:30; Sat-Sun 10:00-1:00 Accepting Newborns with commercial insurance only, must call prior to  delivery to be accepted into  practice.  Medicaid - no, Tricare - yes   [facility] [address] [place], [place] [address] [phone] or [phone] Mon to Fri 8am to 10pm, Sat 8am to 1pm (virtual only on weekends) Only accepts Medicaid Healthy Blue pts  [facility] ([facility]) - Pediatrics at Elige Radon, MD; Sabino Dick, MD; Quitman Livings, MD; Betha Loa, NP; Claretha Cooper, MD; Lelon Perla, MD [address] [place] [place], [place] [address] [phone] Mon-Fri 8:30-5:30 Medicaid - yes, Tricare - yes  [place] [phone]) [facility] Mowers, MD [address], [place], [place] [address] [phone] Iona Hansen, Wed Fri 8:30-5:00, Sat 8:30-12:00, Closed Thursdays Accepting siblings of established patients and first time mom's if you call prenatally Medicaid- yes; Tricare - yes  [facility] Lutricia Feil, [place]; Tracie Harrier, MD; Rusty Aus; Scifres, PA; Wynelle Link, MD; Azucena Cecil, MD;  [address] [place], [place] [address] [phone] Mon-Fri 8:30-5:00, closed for lunch 1-2 Only accepting newborns of established patients Medicaid- no; Tricare - yes  [facility] [phone]) [facility] Morene Crocker, MD; [address], [place], [place] [address] [phone] Mon-Fri 8:00-5:00 Medicaid - No; Tricare - Yes  [facility] at [facility], [place]; [place], [place] [address], [place], [place] [address] [phone] Mon-Fri 8:00-5:00 Medicaid - No, Tricare - Yes  [facility] Cardell Peach, MD; Nash Dimmer, MD; [place], [place] [address] [place], [place] [address] [phone]  Mon-Fri 8:00-5:00 Medicaid - No; Tricare - Yes  [facility] [address], [place], [place] [address] [phone] Mon-Fri 8:30-5:00 (lunch 12:00-1:00) Medicaid -Yes; Tricare - Yes  [facility], MD [address] [place],  [place], [place] [address] [phone] Mon-Fri 8:00-5:00 Seeing newborns of current patients only. No new patients Medicaid - No, Tricare - yes  Nature conservation officer at [place] [address] MD [address] [place], [place] [address] [phone] Mon-Fri 8:00-5:00 Medicaid -yes as secondary coverage only; Tricare - yes  [facility] [place], [place]; [place], [place]; Avis Epley, MD; Vonna Kotyk, MD; Clance Boll, MD; [place], [place]; [place], NP; Vaughan Basta, MD; [place], MD [address] [place], [place] [address] [phone] Mon-Fri 8:30-5:00, Sat 9:00-11:00 Accepts commercial insurance ONLY. Offers free prenatal information sessions for families. Medicaid - No, Tricare - Call first  [facility] [place], MD; [place], [place]; [place], [place]; [place], [place] [address] [place] [address] [phone] Mon-Fri 7:30-5:30 Medicaid - Yes; Ailene Rud yes  [place] [phone] & [phone])  [facility], MD [address] [place], [place] [address] [phone] Mon-Thur 8:00-6:00, closed for lunch 12-2, closed Fridays Medicaid - yes; Tricare - no  [facility] Dareen Piano, NP; Cyndia Bent, MD; [place], [place]; [place], [place] [address], [place], [place] [address] [phone] Mon-Fri 7:30-4:30 Medicaid - yes, Tricare - yes  [facility]  Juanito Doom, MD; Janene Harvey, NP; Vonita Moss, MD; Donn Pierini, NP [address], [place], [place] [address] [phone] Mon-Fri 8:30-5:00, closed for lunch 1-2, Sat 8:30-12:00 - sick visits only Providers come to see  babies at [facility] Only accepting newborns of siblings and first time parents ONLY if who have met with office prior to delivery Medicaid -Yes; Tricare - yes  [facility] - [place], [place]; Spero Geralds, NP; Earlene Plater, MD; Lucretia Roers, MD:  [address], [place], [place] [address] [phone] Mon- Fri 8:00-5:00, Sat 9:00-12:00 - sick  visits only Accepting siblings of established patients and first time mom/baby Medicaid - Yes; Tricare - yes Patients must have vaccinations (baby vaccines)  [place] [phone] & [phone])  Adult nurse HealthCare at [facility] [address] [place], [place] [address] [phone] Mon-Fri 8:00-5:00 Medicaid - no; Tricare - yes  [facility] [place], MD; [place], [place]; [place], [place] [address], [place], [place] [address] [phone] Mon-Fri 8:00-5:00 Medicaid- yes; Tricare - yes  [facility] - Ardeen Jourdain, MD; Yetta Barre, NP; [place], [place] [address] [place], [place], [place] [address] [phone] Mon-Fri 8:00-5:00 Medicaid - Yes; Tricare - yes  [address] [place] [phone])  [facility] [place], [place]; [place], [place]; Eddie Candle, MD; Normand Sloop, MD; [place], NP; [place], DO; [place], [place]; Constance Goltz, MD; Ruthann Cancer, MD; Vear Clock, MD; [place], [place]; [place], [place]; [place], [place] [address] [address] [facility] [address], [place], [place] [address] [phone] Mon-Fri 8:30-5:00, Sat 9:00-12:00 - sick only Please register online [email or web address] then schedule online or call office Medicaid-Yes; Tricare -yes  [facility] - [place]  [place], MD; Romualdo Bolk, MD; Kirby, MD; [place], NP; [place], [place]; Antonietta Barcelona, MD; Mayford Knife, NP; Shelva Majestic, MD [address], [place], [place] [address] [phone] Mon-Fri 8:00-5:30, Sat&Sun by appointment (phones open at 8:30) Medicaid - Yes; Tricare - yes  [place] [phone] & [phone]) [facility] Pediatrics Mariel Aloe; [place], MD; Roger Shelter, MD; Arvilla Market, NP; [place], DO [address], [place], [place] [address] [phone] M-F 8:00 - 5:15, Sat/Sun 9-12 sick visits only Medicaid - No; Tricare - yes  [facility]  [place], PA-C; [place], PA-C; [place], DO; [place], PA-C; [place], PA-C; Roselyn Bering, MD [address] [place], [place] [address] [phone] Mon-Thur 8:00-7:00, Fri 8:00-5:00 Accepting Medicaid for 13 and under only   [facility] - Family Medicine at [place] [facility] [place]) [place], [place]; List, FNP; Berneda Rose, MD; Luther Redo, PA-C; Lavonia Drafts, MD; Kellie Simmering, FNP; Genevie Cheshire, FNP; Evaristo Bury, MD; Berneda Rose, MD [phone] [address] [place], [place] [address] [phone] Mon-Fri 8:30-5:30 Medicaid - Yes; Tricare - yes  [facility] - [address]  [place], [place]; Whitney Post, MD; Hennie Duos, MD; Wynne Dust, MD; [place], NP [address], [place], [place] [address] [phone] Mon-Thur 8:00-5:30, Fri 8:00-5:00, Sat 9:00-12:00 Medicaid - yes, Tricare - yes  [place] [phone])  [facility], [place]; Lenise Arena, MD; [place], [place] [address], [place], [place] [address] [phone] Mon-Fri 8:00-5:00, closed for lunch 12-1 Medicaid - No; Tricare - yes  Nature conservation officer at [facility], MD [address] [place], [place] [address] [phone] Mon-Fri 8:00-5:00 Medicaid - No; Tricare - yes  [facility], MD; Tami Ribas, MD; Mariam Dollar, MD; Yetta Barre, MD [address], [place], [place] [address] [phone] Mon-Fri 8:00-5:00 Medicaid- Yes; Tricare - yes  [place] [phone])  Adult nurse HealthCare at [facility], [place]; [place], MD [address] [place], [place], [place] [address] [phone] Mon-Fri 8:00-5:00 Medicaid - No; Tricare - yes  [facility] - CPNP [address] [place], [place] [address] [phone] Mon-Weds 8:00-6:00, Thurs-Fri 8:00-5:00, Sat 9:00-12:00 Medicaid - yes; Tricare - yes   [facility] Katharina Caper, MD; [place], [place] [address] [place], [place] [address] [phone] Mon-Fri 8:00-5:00 Medicaid - yes; Tricare - yes  [employer]  Pediatric Providers  [facility] [address], [place], [place] [address] [phone] Sheral Flow: 8am -8pm, Tues, Weds: 8am - 5pm; Fri: 8-1 Medicaid - Yes; Tricare - yes  [facility] Rachel Bo, MD; Laural Benes, MD; Anner Crete, MD; [place], [place]; [place], [place] [address], [place], [place] [address] [phone] M-F 8:30 - 5:00 Medicaid - Call office; Tricare -yes  [facility] Edson Snowball, MD; Shanon Rosser, MD, Chelsea Primus, MD; Shirlyn Goltz, PNP; Wardell Heath, NP [phone] [address], [place], [place] [address] [phone] M-F 8:30 - 5:00, Sat/Sun 8:30 - 12:30 (sick visits) Medicaid - Call office; Tricare -yes  [facility] Melvyn Neth, MD; Karl Luke, PNP; Princess Bruins, MD; [place], [place]; [place], NP; Cynda Familia [address], [place], [place] [address] [phone] M-F 8:30 - 5:00 Medicaid - Call office; Tricare - yes  [facility] Jesusita Oka, MD; Dierdre Highman, MD; Earnest Conroy, MD; Timothy Lasso, MD; [place], MD [phone] [address] [place], [place] [address] [phone] M-Thur: 8:00 - 5:00; Fri: 8:00 - 4:00 Medicaid - yes; Tricare - yes  [facility] [address] [place], [place] [address] [phone] M-F: 8:30- 5:00, closed for lunch 12:30 - 1:00 Medicaid - yes; Tricare -yes  [facility] [address] [place], [place] [address] [phone] M-F 8:00 - 5:00 Medicaid - yes; Tricare - yes  [facility] [place], DO; [place], DO; [place], NP [address], [place], [place] [address] [phone] M-F 8:00 - 5:00, Closed 12-1 for lunch Medicaid - Call; Tricare - yes  [facility] Meredith Mody, MD [address], [place], [place] [address] [phone] M-F: 8:00-5:00, Sat: 8:00 - noon Medicaid - call; Tricare -yes  [facility] Pediatric Providers  [facility] [place], [place] [address] [place], [place], [place] [address] [phone] M-W: 8:00-5:00, Thur: 8:00 -  7:00, Fri: 8:00 - noon Medicaid - yes; Tricare - yes  [facility] - Quay Burow, FNP [address], [place], [place] [address] [phone] M-F 8:00 - 5:00, Closed for lunch 12-1 Medicaid - yes; Tricare - yes  [facility] Pediatric Providers  [facility] Primary Care at [place], [place], Alinda Money, MD, [place], FNP-C [address], [facility], [address], [place], [place] [address] [phone] M-T 8:00-5:00, Wed-Fri 7:00-6:00 Medicaid - Yes; Tricare -yes  [facility] at [facility], DO; [address] Saner [place], [place] [address] [phone] M-F 8:00 - 5:00, closed for lunch 12-1 Medicaid - Yes; Tricare - yes  [facility] Pediatrics and Internal Medicine  Zachery Dauer, MD; Gladstone Lighter, MD; Collie Siad, MD; Freda Jackson, MD; Rich Number, MD; Darryl Nestle, MD; Melinda Crutch, MD, Audria Nine, MD; Tawanna Cooler, MD; Steffanie Dunn, MD; Byrd Hesselbach, MD; Lucretia Roers, MD [address] [place], [place] [address] [phone] M-F 8:00-5:00 Medicaid - yes; Tricare - yes  [facility] [place], MD (speaks [place] and Hindi) [address] [place], [place] [address] [phone] M-F: 8:30 - 5:00, closed 12:30 - 1 for lunch Medicaid - Yes; Tricare -yes  [facility] Pediatric Providers  [facility] Shanda Bumps, MD; Chanetta Marshall, MD; Laurell Josephs,  MD [address] [place], [place] [address] [phone] M-Th: 8:00 - 5:30, Fri: 8:00 - 12:00 Medicaid - yes; Tricare - yes  [facility] Pediatrics at [facility], NP; Thora Lance, MD; Orrin Brigham, MD [phone] [address] [place], [place] [address] [phone] M-F: 8:00 - 5:00 Medicaid - yes; Tricare - yes  [facility] [place], NP; Orson Slick, NP; Salley Scarlet, NP; Linton Flemings, MD; Mayford Knife, MD, [place], NP, Emelda Fear, MD; Nida Boatman [address], [place], [place] [address] [phone] M-F: 8:30 - 5:30p Medicaid - yes; Tricare - yes Other locations available as well  [facility], MD; Andrey Campanile, MD; Neville Route, PA-C [address], [place], [place] [address] [phone] M-W: 8:00am - 7:00pm, Thurs: 8:00am - 8:00pm; Fri: 8:00am - 5:00pm, closed daily from 12-1 for lunch Medicaid - yes; Tricare - yes  [facility] Pediatric Providers  [facility] at Levin Erp, MD; Aggie Cosier, FNP; Bland Span, MD; Tristan Schroeder, MD; [place], PNP; Alesia Banda; [place], [place]; Julian Reil, MD;  [address], [place], [place] [address] [phone] Judie Petit - Caleen Essex: 8am - 5pm, Sat 9-noon Medicaid - Yes; Tricare -yes  [facility] at Jaclynn Guarneri, MD; Yetta Barre, FNP; Lilian Kapur, MD; Mariam Dollar, MD [address] [place], [place] [phone] M-F 8:00 - 5:00 Medicaid - call; Tricare - yes  [facility]- Cruz Condon, MD; [place], [place]; Delora Fuel, MD; Dareen Piano, MD; Trudee Grip, MD; Kizzie Ide, MD; Zebedee Iba; Birdena Crandall, MD; Hinton Dyer, MD; [place], MD [address], [place], [place] [address] [phone] M-F 8:00am - 5:00pm; Sat. 9:00 - 11:00 Medicaid - yes; Tricare - yes  [facility] at [facility], MD [address] [place], [place] [address] [phone] M-F 8:00 - 5:00 Medicaid - [place] Medicaid only; Tricare - yes  [facility] - Illene Bolus, MD; Earlene Plater, [place]; Kenyon Ana, MD [address], [place], [place] [address] [phone] M-F 8:00 - 5:00 Medicaid - yes; Tricare - yes  [facility] [address] Pediatrics - Lind Covert, MD; Manson Passey, MD, [facility], MD, [place], MD; [place], MD; Katrinka Blazing, MD; [address] [place], [place] [address] [phone] M-F: 8-5 Medicaid - yes; Tricare - yes  [facility] - Henrietta Hoover, [place]; [place], MD; [address] [place], [place] [address] [phone] M-F 8-5 Medicaid - yes; Tricare - yes  [address] [place] Darrol Poke, MD; Tami Ribas, MD; [place], MD; [place], DNP;  [place], PNP [address], [place], [place] [address] [phone] M-F 8-5 Medicaid - yes; Tricare - yes  [facility] Internal Medicine and Pediatrics Delories Heinz, MD; Adrienne Mocha; Ala Bent, MD [address] [place], [place] [address] [phone] M-F 7am - 5 pm Medicaid - call; Tricare - yes  [facility] [place], [place]; Fredia Beets, MD; Roxan Hockey, MD [address] [place], [place] [address] [phone] M-F 8-5 Medicaid - yes; Tricare - yes  [facility] Kae Heller, MD; Sheliah Hatch, MD; Mayford Knife, FNP; Shon Baton, FNP; Tyron Russell, FNP; Ishmael Holter; [facility] - FNP [address] [place], [place] [address] [phone] M-F 8-5 Medicaid- yes; Tricare - yes  [facility] - Betsy Coder, [place] and [place] Guest, MD; Terrial Rhodes, MD; Hulda Humphrey, MD; Roseanne [place], MD; [place], [place]; Ala Dach, MD; Fredia Beets, MD; Dimple Casey, MD [address], [place], [place] [address] [phone] M-F: 8-5, Sat: 9-4, Sun 9-12 Medicaid - yes; Tricare - yes  [facility] [place], PNP; [place], PNP [address] [place],  [place] [address] [phone] M-F 8 - 5, closed 12-1 for lunch Medicaid - yes; Tricare - yes  [facility] Kathyrn Lass, MD; Hal Neer, MD; Dimple Casey, MD; [place], DO [address] [place], [place] [address] [phone] M-F 8- 5:30 Medicaid - yes; Tricare - yes  [facility] - Biagio Quint, MD; Rosalia Hammers, MD; Gwenith Daily, MD [address] [place], [place] [address] [phone] Judie Petit: Nicholas Lose; Tues-Fri: 8-5; Sat: 9-12 Medicaid - yes; Tricare - yes  [facility] - Bobbye Morton, MD; Daphane Shepherd, MD; Chestine Spore, MD; Haskell Riling, MD; Kate Sable, MD [address] [place], [place] [address] [phone] Judie PetitMarland Kitchen Nicholas LoseFrancee Nodal: 8-5; Sat: 8:30-12:30 Medicaid - yes;  Tricare - yes  [facility] Heck[facility]s - Beckey Rutter, MD; [place], [place] [address], [place], [place] [address] [phone] Mon-Fri: 8-5 Medicaid - yes; Tricare - yes  [facility] Pediatrics - [place], CPNP; [place], [place]; Dimple Casey, MD; Alisa Graff, MD; Cephus Shelling, MD; [address], [place], [place] [address] [phone] M-F: 8-5, closed 1-2 for lunch Medicaid - yes; Tricare - yes  [facility] Pediatrics - [facility] [place], [place]; [place], [place]; Katrinka Blazing, MD; [place], CPNP; [place], [place]; [place], MD; Earlene Plater, MD [address], [place], [place] [address] [phone] M-Thurs: Nicholas Lose; Fri: 8-6; Sat: 9-12; Sun 2-4 Medicaid - yes; Tricare - yes  [facility] Georgeanna Lea, MD; Evette Cristal, MD; Shea Stakes, FNP; Earney Mallet, DO; [address] [place], [place] [address] [phone] M-F: 8-5 Medicaid - yes; Tricare - yes  [facility] Pediatric Providers  [facility] Family Medicine -Collene Mares, MD; [place], NP [address], [place], [place] [address] [phone] M - Fri: 8am - 5pm, closed for lunch 12-1 Medicaid - Yes; Tricare - yes  [facility] Elinor Parkinson, MD; Victory Dakin, MD; [place], DO; Vinocur, MD;Hall, PA; Clent [place], [place]; Orvan Falconer, NP [phone] [address] [place], [place] [address] [phone] M-F 8:00 - 5:00, Sat 8:00 - 11:30 Medicaid - yes; Tricare - yes  [facility] Welton Flakes, MD; [place], MD, [address], MD, [place], MD, [place], MD; [place], NP; [place], [place];  [address] [place], [place] [address] [phone] M-F 8:10am - 5:00pm Medicaid - yes; Tricare - yes  [facility] Eustaquio Boyden, MD; [place], NP [address] [place], [place] [address] [phone] M-F 8:00 - 5:00 Medicaid - [place] Medicaid only; Tricare - yes  [facility] - [facility] [address] [place], MD; [place], NP [address], [place], [place] [address] [phone] M-F 8:00 - 5:00; Closed for lunch 12 - 1:00 Medicaid - yes; Tricare - yes  [facility] Belva Crome, MD; Jonita Albee, FNP [address], [place], [place] [address] [phone] Mon 9-5; Tues/Wed 10-5; Thurs 8:30-5; Fri: 8-12:30 Medicaid - yes; Tricare - yes  [facility] Pediatric Providers  [facility]  [place], MD; [place], [place] [address] [place], [place] [address] [phone] phone [phone] fax M-F 7:15 - 4:30 Medicaid - yes; Tricare - yes  [place] -  Pediatrics Karilyn Cota, MD; [place], DO [address], [place], [place] [address] [phone] M-Fri: 8:30 - 5:00, closed for lunch everyday noon - 1pm Medicaid - Yes; Tricare - yes  [facility] Burd[place], MD; Reuel Boom, MD; Dimas Aguas, MD; Neita Carp, MD; [place], [place]; Bonnita Nasuti, [place]; [place], [place]; [place],  [place]; [place], [place] [address] [place], [place] [address] [phone] M-Thurs: 7:30am - 7:00pm; Friday 7:30am - 4pm; Sat: 8:00 - 1:00 Medicaid - Yes; Tricare - yes  [place] [facility] Nor[facility] Morton, MD; Conni Elliot, MD; Carroll Kinds, MD; [place], DO [address], [place], [place] [address] [phone] M-Thur: 8:00 - 5:00, Fri: 8:00 - Noon Medicaid - yes; Tricare - yes No  [facility]  [place] [facility] [place], MD; Nadine Counts, DO; [place], NP; Daphine Deutscher, NP; Lequita Halt, NP; Ellamae Sia, NP; Reginia Forts, NP; Darlyn Read, MD; [place], [place] [address], [place], [place] [address] [phone] M-F 8:00 - 5:00 Medicaid - yes; Tricare - yes  [facility], FNP-C; [place], FNP-C [address] [place], [place] [address] [phone] M, W, R 8:00-5:00, Tues: 8:00am - 7:00pm; Fri 8:00 - noon Medicaid - Yes; Tricare - yes  [facility], MD [address] [place], [place] [address] [phone]  M-Thurs 8:30-5:30, Fri: 8:30-12:30pm Medicaid - Yes; Tricare - N    Considering Waterbirth? Guide for patients at Center for [facility]) Why consider waterbirth? Gentle birth for babies  Less pain medicine used in labor  May allow for passive descent/less pushing  May reduce perineal tears  More mobility and instinctive maternal position changes  Increased maternal relaxation   Is waterbirth safe? What are the risks of infection, drowning or other complications? Infection:  Very low risk (3.7 % for tub vs 4.8% for bed)  7 in 8000 waterbirths with documented infection  Poorly cleaned equipment most common cause  Slightly lower group B strep transmission rate  Drowning  Maternal:  Very low risk  Related to seizures or fainting  Newborn:  Very low risk. No evidence of increased risk of respiratory problems in multiple large studies  Physiological protection from breathing under water  Avoid underwater birth if there are any fetal complications  Once baby's head is out of the water, keep it out.  Birth complication  Some reports of cord trauma, but risk decreased by bringing baby to surface gradually  No evidence of increased risk of shoulder dystocia. Mothers can usually change positions faster in water than in a bed, possibly aiding the maneuvers to free the shoulder.   There are 2 things you MUST do to have a waterbirth with [facility]: Attend a waterbirth class at [facility] at [facility]   3rd Wednesday of every month from 7-9 pm (virtual during COVID) [employer] at [email or web address] or [email or web address] or by calling [phone] Bring [place] the certificate from the class to your prenatal appointment or send via MyChart Meet with a midwife at 36 weeks* to see if you can still plan a waterbirth and to sign the consent.   *We also recommend that you schedule as many of your prenatal  visits with a midwife as possible.    Helpful information: You may want to bring a bathing suit top to the hospital to wear during labor but this is optional.  All other supplies are provided by the hospital. Please arrive at the hospital with signs of active labor, and do not wait at home until late in labor. It takes 45 min- 1 hour for fetal monitoring, and check in to your room to take place, plus transport and filling of the waterbirth tub.    Things that would prevent you from having a waterbirth: Premature, <37wks  Previous cesarean birth  Presence of thick meconium-stained fluid  Multiple gestation (Twins, triplets, etc.)  Uncontrolled diabetes or gestational diabetes requiring medication  Hypertension diagnosed in pregnancy or preexisting hypertension (gestational hypertension, preeclampsia, or chronic hypertension) Fetal growth restriction (your baby measures less than 10th percentile on ultrasound) Heavy vaginal bleeding  Non-reassuring fetal heart rate  Active infection (MRSA, etc.). Group B Strep is NOT a contraindication for waterbirth.  If your labor has to be induced and induction method requires continuous monitoring of the baby's heart rate  Other risks/issues identified by your obstetrical provider   Please remember that birth is unpredictable. Under certain unforeseeable circumstances your provider may advise against giving birth in the tub. These decisions will be made on a case-by-case basis and with the safety of you and your baby as our highest priority.

## 2024-01-07 ENCOUNTER — Other Ambulatory Visit (HOSPITAL_COMMUNITY)
Admission: RE | Admit: 2024-01-07 | Discharge: 2024-01-07 | Disposition: A | Discharge status: Home / Self Care | Source: Ambulatory Visit | Attending: Family Medicine | Admitting: Family Medicine

## 2024-01-07 ENCOUNTER — Other Ambulatory Visit: Payer: Self-pay

## 2024-01-07 ENCOUNTER — Ambulatory Visit (INDEPENDENT_AMBULATORY_CARE_PROVIDER_SITE_OTHER): Payer: Self-pay | Admitting: Family Medicine

## 2024-01-07 VITALS — BP 130/79 | HR 67 | Wt 158.0 lb

## 2024-01-07 DIAGNOSIS — Z1332 Encounter for screening for maternal depression: Secondary | ICD-10-CM

## 2024-01-07 DIAGNOSIS — Z31430 Encounter of female for testing for genetic disease carrier status for procreative management: Secondary | ICD-10-CM | POA: Insufficient documentation

## 2024-01-07 DIAGNOSIS — Z8619 Personal history of other infectious and parasitic diseases: Secondary | ICD-10-CM

## 2024-01-07 DIAGNOSIS — Z3490 Encounter for supervision of normal pregnancy, unspecified, unspecified trimester: Secondary | ICD-10-CM | POA: Diagnosis present

## 2024-01-07 DIAGNOSIS — Z3A13 13 weeks gestation of pregnancy: Secondary | ICD-10-CM | POA: Diagnosis not present

## 2024-01-07 DIAGNOSIS — Z3491 Encounter for supervision of normal pregnancy, unspecified, first trimester: Secondary | ICD-10-CM | POA: Insufficient documentation

## 2024-01-07 MED ORDER — ASPIRIN 81 MG PO TBEC: 81.0000 mg | DELAYED_RELEASE_TABLET | Freq: Every day | ORAL | 12 refills | Status: AC

## 2024-01-07 NOTE — Progress Notes (Unsigned)
 Subjective:   Madison Jones is a 33 y.o. G1P0000 at [redacted]w[redacted]d by LMP being seen today for her first obstetrical visit.  Her obstetrical history is significant for {ob risk factors:10154}. Patient {does/does not:19097} intend to breast feed. Pregnancy history fully reviewed.  Patient reports {sx:14538}.  HISTORY: OB History  Gravida Para Term Preterm AB Living  1 0 0 0 0 0  SAB IAB Ectopic Multiple Live Births  0 0 0 0 0    # Outcome Date GA Lbr Len/2nd Weight Sex Type Anes PTL Lv  1 Current            Last pap smear was  *** and was {Normal/Abnormal Appearance:21344::normal} No past medical history on file. No past surgical history on file. Family History  Problem Relation Age of Onset   Arthritis Mother    Depression Mother    Hypertension Mother    Arthritis Father    Depression Father    Heart attack Father    Hypertension Father    Rheum arthritis Father    Asthma Father    Diabetes Father    Heart disease Father    Obesity Father    Stroke Father    Depression Sister    Asthma Sister    Learning disabilities Sister    Birth defects Sister    Obesity Sister    Arthritis Maternal Grandmother    Asthma Maternal Grandmother    Diabetes Maternal Grandmother    Hypertension Maternal Grandmother    Arthritis Maternal Grandfather    Asthma Maternal Grandfather    Skin cancer Maternal Grandfather    Stroke Maternal Grandfather    Cancer Maternal Grandfather    Hearing loss Maternal Grandfather    Heart disease Maternal Grandfather    Rheum arthritis Paternal Grandmother    Arthritis Paternal Grandmother    Asthma Paternal Grandmother    Cancer Paternal Grandfather    Early death Paternal Grandfather    Heart disease Sister    COPD Paternal Aunt    Diabetes Paternal Aunt    Vision loss Paternal Aunt    Diabetes Paternal Aunt    Vision loss Paternal Aunt    Social History   Tobacco Use   Smoking status: Never   Smokeless tobacco: Never  Vaping Use    Vaping status: Never Used  Substance Use Topics   Alcohol use: Yes    Comment: occ   Drug use: No   No Known Allergies Current Outpatient Medications on File Prior to Visit  Medication Sig Dispense Refill   Prenatal Vit-Fe Fumarate-FA (MULTIVITAMIN-PRENATAL) 27-0.8 MG TABS tablet Take 1 tablet by mouth daily at 12 noon.     No current facility-administered medications on file prior to visit.     Exam   Vitals:   01/07/24 1413  BP: 130/79  Pulse: 67  Weight: 158 lb (71.7 kg)   Fetal Heart Rate (bpm): 151  Uterus:     Pelvic Exam: Perineum: no hemorrhoids, normal perineum   Vulva: normal external genitalia, no lesions   Vagina:  normal mucosa, normal discharge   Cervix: no lesions and normal, pap smear done.    Adnexa: normal adnexa and no mass, fullness, tenderness   Bony Pelvis: average  System: General: well-developed, well-nourished female in no acute distress   Breast:  normal appearance, no masses or tenderness   Skin: normal coloration and turgor, no rashes   Neurologic: oriented, normal, negative, normal mood   Extremities: normal strength, tone,  and muscle mass, ROM of all joints is normal   HEENT PERRLA, extraocular movement intact and sclera clear, anicteric   Mouth/Teeth mucous membranes moist, pharynx normal without lesions and dental hygiene good   Neck supple and no masses   Cardiovascular: regular rate and rhythm   Respiratory:  no respiratory distress, normal breath sounds   Abdomen: soft, non-tender; bowel sounds normal; no masses,  no organomegaly     Assessment:   Pregnancy: G1P0000 Patient Active Problem List   Diagnosis Date Noted   Supervision of low-risk pregnancy 12/23/2023   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 Encounter for supervision of low-risk pregnancy, antepartum ***  2. History of PCR DNA positive for HSV2 ***  3. [redacted] weeks gestation of pregnancy (Primary) ***   Initial labs drawn. Continue  prenatal vitamins. Genetic Screening discussed, NIPS: ordered. Ultrasound discussed; fetal anatomic survey: previously scheduled . Problem list reviewed and updated. The nature of [place] [facility] with multiple MDs and other Advanced Practice Providers was explained to patient; also emphasized that residents, students are part of our team. Routine obstetric precautions reviewed. No follow-ups on file.

## 2024-01-08 LAB — CBC/D/PLT+RPR+RH+ABO+RUBIGG...
Antibody Screen: NEGATIVE
Basophils Absolute: 0 x10E3/uL (ref 0.0–0.2)
Basos: 1 %
EOS (ABSOLUTE): 0.1 x10E3/uL (ref 0.0–0.4)
Eos: 1 %
HCV Ab: NONREACTIVE
HIV Screen 4th Generation wRfx: NONREACTIVE
Hematocrit: 40 % (ref 34.0–46.6)
Hemoglobin: 12.6 g/dL (ref 11.1–15.9)
Hepatitis B Surface Ag: NEGATIVE
Immature Grans (Abs): 0 x10E3/uL (ref 0.0–0.1)
Immature Granulocytes: 0 %
Lymphocytes Absolute: 1.3 x10E3/uL (ref 0.7–3.1)
Lymphs: 20 %
MCH: 26.1 pg — ABNORMAL LOW (ref 26.6–33.0)
MCHC: 31.5 g/dL (ref 31.5–35.7)
MCV: 83 fL (ref 79–97)
Monocytes Absolute: 0.3 x10E3/uL (ref 0.1–0.9)
Monocytes: 4 %
Neutrophils Absolute: 4.8 x10E3/uL (ref 1.4–7.0)
Neutrophils: 74 %
Platelets: 358 x10E3/uL (ref 150–450)
RBC: 4.82 x10E6/uL (ref 3.77–5.28)
RDW: 14.9 % (ref 11.7–15.4)
RPR Ser Ql: NONREACTIVE
Rh Factor: POSITIVE
Rubella Antibodies, IGG: 1.81 {index} (ref >0.99)
WBC: 6.5 x10E3/uL (ref 3.4–10.8)

## 2024-01-08 LAB — HEMOGLOBIN A1C
Est. average glucose Bld gHb Est-mCnc: 94 mg/dL
Hgb A1c MFr Bld: 4.9 % (ref 4.8–5.6)

## 2024-01-08 LAB — HCV INTERPRETATION

## 2024-01-09 LAB — CERVICOVAGINAL ANCILLARY ONLY
Bacterial Vaginitis (gardnerella): POSITIVE — AB
Candida Glabrata: NEGATIVE
Candida Vaginitis: POSITIVE — AB
Chlamydia: NEGATIVE
Comment: NEGATIVE
Comment: NEGATIVE
Comment: NEGATIVE
Comment: NEGATIVE
Comment: NEGATIVE
Comment: NORMAL
Neisseria Gonorrhea: NEGATIVE
Trichomonas: NEGATIVE

## 2024-01-09 LAB — CULTURE, OB URINE

## 2024-01-09 LAB — URINE CULTURE, OB REFLEX

## 2024-01-13 ENCOUNTER — Ambulatory Visit: Payer: Self-pay | Admitting: Family Medicine

## 2024-01-13 LAB — CYTOLOGY - PAP
Comment: NEGATIVE
Diagnosis: NEGATIVE
Diagnosis: REACTIVE
High risk HPV: NEGATIVE

## 2024-01-13 MED ORDER — MICONAZOLE NITRATE 2 % VA CREA: 1.0000 | TOPICAL_CREAM | Freq: Every day | VAGINAL | 0 refills | Status: AC

## 2024-01-13 MED ORDER — METRONIDAZOLE 500 MG PO TABS: 500.0000 mg | ORAL_TABLET | Freq: Two times a day (BID) | ORAL | 0 refills | Status: DC

## 2024-01-14 LAB — PANORAMA PRENATAL TEST FULL PANEL:PANORAMA TEST PLUS 5 ADDITIONAL MICRODELETIONS: FETAL FRACTION: 7.7

## 2024-01-19 LAB — HORIZON CUSTOM: REPORT SUMMARY: NEGATIVE

## 2024-02-05 ENCOUNTER — Ambulatory Visit (INDEPENDENT_AMBULATORY_CARE_PROVIDER_SITE_OTHER): Admitting: Family Medicine

## 2024-02-05 ENCOUNTER — Other Ambulatory Visit: Payer: Self-pay

## 2024-02-05 VITALS — BP 126/81 | HR 71 | Wt 164.8 lb

## 2024-02-05 DIAGNOSIS — Z3492 Encounter for supervision of normal pregnancy, unspecified, second trimester: Secondary | ICD-10-CM

## 2024-02-05 NOTE — Progress Notes (Signed)
   Subjective:  Madison Jones is a 33 y.o. G1P0000 at [redacted]w[redacted]d being seen today for ongoing prenatal care.  She is currently monitored for the following issues for this low-risk pregnancy and has History of PCR DNA positive for HSV2 and Supervision of low-risk pregnancy on their problem list.  Patient reports no complaints.  Contractions: Not present. Vag. Bleeding: None.  Movement: Present. Denies leaking of fluid.   The following portions of the patient's history were reviewed and updated as appropriate: allergies, current medications, past family history, past medical history, past social history, past surgical history and problem list. Problem list updated.  Objective:   Vitals:   02/05/24 1512  BP: 126/81  Pulse: 71  Weight: 164 lb 12.8 oz (74.8 kg)    Fetal Status: Fetal Heart Rate (bpm): 153   Movement: Present     General:  Alert, oriented and cooperative. Patient is in no acute distress.  Skin: Skin is warm and dry. No rash noted.   Cardiovascular: Normal heart rate noted  Respiratory: Normal respiratory effort, no problems with respiration noted  Abdomen: Soft, gravid, appropriate for gestational age. Pain/Pressure: Absent     Pelvic: Vag. Bleeding: None     Cervical exam deferred        Extremities: Normal range of motion.  Edema: None  Mental Status: Normal mood and affect. Normal behavior. Normal judgment and thought content.   Urinalysis:      Assessment and Plan:  Pregnancy: G1P0000 at [redacted]w[redacted]d  1. Encounter for supervision of low-risk pregnancy in second trimester (Primary) BP and FHR normal AFP offered, collected  Preterm labor symptoms and general obstetric precautions including but not limited to vaginal bleeding, contractions, leaking of fluid and fetal movement were reviewed in detail with the patient. Please refer to After Visit Summary for other counseling recommendations.  Return in 4 weeks (on 03/04/2024) for [place] patient, ob visit.   Lola Donnice HERO,  MD

## 2024-02-05 NOTE — Patient Instructions (Addendum)
 Considering Waterbirth? Guide for patients at Center for [facility]) Why consider waterbirth? Gentle birth for babies  Less pain medicine used in labor  May allow for passive descent/less pushing  May reduce perineal tears  More mobility and instinctive maternal position changes  Increased maternal relaxation   Is waterbirth safe? What are the risks of infection, drowning or other complications? Infection:  Very low risk (3.7 % for tub vs 4.8% for bed)  7 in 8000 waterbirths with documented infection  Poorly cleaned equipment most common cause  Slightly lower group B strep transmission rate  Drowning  Maternal:  Very low risk  Related to seizures or fainting  Newborn:  Very low risk. No evidence of increased risk of respiratory problems in multiple large studies  Physiological protection from breathing under water  Avoid underwater birth if there are any fetal complications  Once baby's head is out of the water, keep it out.  Birth complication  Some reports of cord trauma, but risk decreased by bringing baby to surface gradually  No evidence of increased risk of shoulder dystocia. Mothers can usually change positions faster in water than in a bed, possibly aiding the maneuvers to free the shoulder.   There are 2 things you MUST do to have a waterbirth with [facility]: Attend a waterbirth class at [facility] at [facility]   3rd Wednesday of every month from 7-9 pm (virtual during COVID) [employer] at [email or web address] or [email or web address] or by calling [phone] Bring us  the certificate from the class to your prenatal appointment or send via MyChart Meet with a midwife at 36 weeks* to see if you can still plan a waterbirth and to sign the consent.   *We also recommend that you schedule as many of your prenatal visits with a midwife as possible.    Helpful information: You may want to bring a bathing suit top to the hospital  to wear during labor but this is optional.  All other supplies are provided by the hospital. Please arrive at the hospital with signs of active labor, and do not wait at home until late in labor. It takes 45 min- 1 hour for fetal monitoring, and check in to your room to take place, plus transport and filling of the waterbirth tub.    Things that would prevent you from having a waterbirth: Premature, <37wks  Previous cesarean birth  Presence of thick meconium-stained fluid  Multiple gestation (Twins, triplets, etc.)  Uncontrolled diabetes or gestational diabetes requiring medication  Hypertension diagnosed in pregnancy or preexisting hypertension (gestational hypertension, preeclampsia, or chronic hypertension) Fetal growth restriction (your baby measures less than 10th percentile on ultrasound) Heavy vaginal bleeding  Non-reassuring fetal heart rate  Active infection (MRSA, etc.). Group B Strep is NOT a contraindication for waterbirth.  If your labor has to be induced and induction method requires continuous monitoring of the baby's heart rate  Other risks/issues identified by your obstetrical provider   Please remember that birth is unpredictable. Under certain unforeseeable circumstances your provider may advise against giving birth in the tub. These decisions will be made on a case-by-case basis and with the safety of you and your baby as our highest priority.    Updated 08/15/21   Birth Control Options Birth control is also called contraception. Birth control prevents pregnancy. There are many types of birth control. Work with your health care provider to find the best option for you. Birth control that uses hormones These types of  birth control have hormones in them to prevent pregnancy. Birth control implant This is a small tube that is put into the skin of your arm. The tube can stay in for up to 3 years. Birth control shot These are shots you get every 3 months. Birth control  pills This is a pill you take every day. You need to take it at the same time each day. Birth control patch This is a patch that you put on your skin. You change it 1 time each week for 3 weeks. After that, you take the patch off for 1 week. Vaginal ring  This is a soft plastic ring that you put in your vagina. The ring is left in for 3 weeks. Then, you take it out for 1 week. Then, you put a new ring in. Barrier methods  Female condom This is a thin covering that you put on the penis before sex. The condom is thrown away after sex. Female condom This is a soft, loose covering that you put in the vagina before sex. The condom is thrown away after sex. Diaphragm A diaphragm is a soft barrier that is shaped like a bowl. It must be made to fit your body. You put it in the vagina before sex with a chemical that kills sperm called spermicide. A diaphragm should be left in the vagina for 6-8 hours after sex and taken out within 24 hours. You need to replace a diaphragm: Every 1-2 years. After giving birth. After gaining more than 15 lb (6.8 kg). If you have surgery on your pelvis. Cervical cap This is a small, soft cup that fits over the cervix. The cervix is the lowest part of the uterus. It's put in the vagina before sex, along with spermicide. The cap must be made for you. The cap should be left in for 6-8 hours after sex. It is taken out within 48 hours. A cervical cap must be prescribed and fit to your body by a provider. It should be replaced every 2 years. Sponge This is a small sponge that is put into the vagina before sex. It must be left in for at least 6 hours after sex. It must be taken out within 30 hours and thrown away. Spermicides These are chemicals that kill or stop sperm from getting into the uterus. They may be a pill, cream, jelly, or foam that you put into your vagina. They should be used at least 10-15 minutes before sex. Intrauterine device An intrauterine device (IUD) is  a device that's put in the uterus by a provider. There are two types: Hormone IUD. This kind can stay in for 3-5 years. Copper IUD. This kind can stay in for 10 years. Permanent birth control Female tubal ligation This is surgery to block the fallopian tubes. Female sterilization This is a surgery, called a vasectomy, to tie off the tubes that carry sperm in men. This method takes 3 months to work. Other forms of birth control must be used for 3 months. Natural planning methods This means not having sex on the days the female partner could get pregnant. Here are some types of natural planning birth control: Using a calendar: To keep track of the length of each menstrual cycle. To find out what days pregnancy can happen. To plan to not have sex on days when pregnancy can happen. Watching for signs of ovulation and not having sex during this time. The female partner can check for ovulation by keeping track  of their temperature each day. They can also look for changes in the mucus that comes from the cervix. Where to find more information Centers for Disease Control and Prevention: TonerPromos.no. Then: Enter birth control in the search box. This information is not intended to replace advice given to you by your health care provider. Make sure you discuss any questions you have with your health care provider. Document Revised: 01/30/2023 Document Reviewed: 06/25/2022 Elsevier Patient Education  2024 [employer].

## 2024-02-07 LAB — AFP, SERUM, OPEN SPINA BIFIDA
AFP MoM: 1.21
AFP Value: 51.5 ng/mL
Gest. Age on Collection Date: 17.6 wk
Maternal Age At EDD: 33.2 a
OSBR Risk 1 IN: 10000
Test Results:: NEGATIVE
Weight: 164 [lb_av]

## 2024-02-09 ENCOUNTER — Ambulatory Visit: Payer: Self-pay | Admitting: Family Medicine

## 2024-02-09 DIAGNOSIS — Z3492 Encounter for supervision of normal pregnancy, unspecified, second trimester: Secondary | ICD-10-CM

## 2024-03-02 ENCOUNTER — Other Ambulatory Visit: Payer: Self-pay | Admitting: Family Medicine

## 2024-03-02 ENCOUNTER — Ambulatory Visit: Admitting: Maternal & Fetal Medicine

## 2024-03-02 ENCOUNTER — Ambulatory Visit: Attending: Family Medicine

## 2024-03-02 VITALS — BP 116/64

## 2024-03-02 DIAGNOSIS — Z3492 Encounter for supervision of normal pregnancy, unspecified, second trimester: Secondary | ICD-10-CM | POA: Insufficient documentation

## 2024-03-02 DIAGNOSIS — O35BXX0 Maternal care for other (suspected) fetal abnormality and damage, fetal cardiac anomalies, not applicable or unspecified: Secondary | ICD-10-CM

## 2024-03-02 DIAGNOSIS — Z363 Encounter for antenatal screening for malformations: Secondary | ICD-10-CM | POA: Diagnosis not present

## 2024-03-02 DIAGNOSIS — O283 Abnormal ultrasonic finding on antenatal screening of mother: Secondary | ICD-10-CM | POA: Diagnosis not present

## 2024-03-02 DIAGNOSIS — Z3A21 21 weeks gestation of pregnancy: Secondary | ICD-10-CM | POA: Diagnosis not present

## 2024-03-02 DIAGNOSIS — Z3689 Encounter for other specified antenatal screening: Secondary | ICD-10-CM | POA: Insufficient documentation

## 2024-03-02 DIAGNOSIS — Z3490 Encounter for supervision of normal pregnancy, unspecified, unspecified trimester: Secondary | ICD-10-CM

## 2024-03-02 NOTE — Progress Notes (Signed)
 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 G1P0000 at [redacted]w[redacted]d here for ultrasound and consultation. She had low risk aneuploidy screening of a female fetus. Carrier screening was Negative for the basic screening (SMA, alpha-thal, beta-thal, and cystic fibroisis. Maternal serum AFP was negative. She has no acute concerns.   Today we focused on the following:   Echogenic intracardiac focus An intracardiac echogenic focus was noted on today's ultrasound. This is thought to represent a calcification of the ventricular papillary muscles.  There is no adverse cardiac function associated with this finding.  This is seen in about 3% to 4% of normal pregnancies but is higher in a pregnancy affected by Down Syndrome. Historically there was association with aneuploidy but in the presence of normal aneuploidy screening this is considered a normal variant. I reassured the patient that the likelihood of Down syndrome is extremely low (<1/10,000) based on her NIPT testing.   Sonographic findings Single intrauterine pregnancy at 21w 4d. Fetal cardiac activity:  Observed and appears normal. Presentation: Cephalic. The anatomic structures that were well seen appear normal. The anatomic survey is complete.  Fetal biometry shows the estimated fetal weight at the 75 percentile. Amniotic fluid: Within normal limits.  MVP: 5.61 cm. Placenta: Anterior. Adnexa: No abnormality visualized. Cervical length: 3.3 cm.  There are limitations of prenatal ultrasound such as the inability to detect certain abnormalities due to poor visualization. Various factors such as fetal  position, gestational age and maternal body habitus may increase the difficulty in visualizing the fetal anatomy.    Recommendations -EDD should be 07/09/2024 based on  LMP  (10/03/23). -No further ultrasounds are recommended at this time based on the current indications. If future indications arise (e.g. size/date discrepancy on fundal height, gestational diabetes or hypertension) and an ultrasound is to be desired at our MFM office, please send a referral.   Review of Systems: A review of systems was performed and was negative except per HPI   Past Obstetrical History:  OB History  Gravida Para Term Preterm AB Living  1 0 0 0 0 0  SAB IAB Ectopic Multiple Live Births  0 0 0 0 0    # Outcome Date GA Lbr Len/2nd Weight Sex Type Anes PTL Lv  1 Current              Past Medical History:  Past Medical History:  Diagnosis Date   Medical history non-contributory      Past Surgical History:    Past Surgical History:  Procedure Laterality Date   NO PAST SURGERIES       Home Medications:   Current Outpatient Medications on File Prior to Visit  Medication Sig Dispense Refill   aspirin  EC 81 MG tablet Take 1 tablet (81 mg total) by mouth daily. Swallow whole. 30 tablet 12   Prenatal Vit-Fe Fumarate-FA (MULTIVITAMIN-PRENATAL) 27-0.8 MG TABS tablet Take 1 tablet by mouth daily at 12 noon.     metroNIDAZOLE  (FLAGYL ) 500 MG tablet Take 1 tablet (500 mg total) by mouth 2 (two) times daily. (Patient not taking: Reported on 02/05/2024) 14 tablet 0   No current  facility-administered medications on file prior to visit.      Allergies:   No Known Allergies   Physical Exam:   Vitals:   03/02/24 1018  BP: 116/64   Sitting comfortably on the sonogram table Nonlabored breathing Normal rate and rhythm Abdomen is nontender  Thank you for the opportunity to be involved with this patient's care. Please let us  know if we can be of any further assistance.   30 minutes of time was spent  reviewing the patient's chart including labs, imaging and documentation.  At least 50% of this time was spent with direct patient care discussing the diagnosis, management and prognosis of her care.  Delora Smaller MFM, [place]   03/02/2024  11:10 AM

## 2024-03-04 ENCOUNTER — Ambulatory Visit: Admitting: Family Medicine

## 2024-03-04 ENCOUNTER — Other Ambulatory Visit: Payer: Self-pay

## 2024-03-04 VITALS — BP 110/76 | HR 71 | Wt 168.4 lb

## 2024-03-04 DIAGNOSIS — O283 Abnormal ultrasonic finding on antenatal screening of mother: Secondary | ICD-10-CM | POA: Diagnosis not present

## 2024-03-04 DIAGNOSIS — Z3492 Encounter for supervision of normal pregnancy, unspecified, second trimester: Secondary | ICD-10-CM

## 2024-03-04 DIAGNOSIS — Z3A21 21 weeks gestation of pregnancy: Secondary | ICD-10-CM | POA: Diagnosis not present

## 2024-03-04 NOTE — Patient Instructions (Signed)

## 2024-03-04 NOTE — Progress Notes (Signed)
   Subjective:  Madison Jones is a 33 y.o. G1P0000 at [redacted]w[redacted]d being seen today for ongoing prenatal care.  She is currently monitored for the following issues for this low-risk pregnancy and has History of PCR DNA positive for HSV2; Supervision of low-risk pregnancy; and Fetal echogenic intracardiac focus on prenatal ultrasound on their problem list.  Patient reports no complaints.  Contractions: Not present. Vag. Bleeding: None.  Movement: Present. Denies leaking of fluid.   The following portions of the patient's history were reviewed and updated as appropriate: allergies, current medications, past family history, past medical history, past social history, past surgical history and problem list. Problem list updated.  Objective:   Vitals:   03/04/24 0905  BP: 110/76  Pulse: 71  Weight: 168 lb 7 oz (76.4 kg)    Fetal Status: Fetal Heart Rate (bpm): 145   Movement: Present     General:  Alert, oriented and cooperative. Patient is in no acute distress.  Skin: Skin is warm and dry. No rash noted.   Cardiovascular: Normal heart rate noted  Respiratory: Normal respiratory effort, no problems with respiration noted  Abdomen: Soft, gravid, appropriate for gestational age. Pain/Pressure: Absent     Pelvic: Vag. Bleeding: None     Cervical exam deferred        Extremities: Normal range of motion.  Edema: None  Mental Status: Normal mood and affect. Normal behavior. Normal judgment and thought content.   Urinalysis:      Assessment and Plan:  Pregnancy: G1P0000 at [redacted]w[redacted]d  1. Encounter for supervision of low-risk pregnancy in second trimester (Primary) BP and FHR normal Up to date on labs Anatomy scan normal, f/u as indicated Would like to defer flu shot to next visit Finished waterbirth class  2. Fetal echogenic intracardiac focus on prenatal ultrasound Normal anatomy scan Low risk NIPT  Preterm labor symptoms and general obstetric precautions including but not limited to vaginal  bleeding, contractions, leaking of fluid and fetal movement were reviewed in detail with the patient. Please refer to After Visit Summary for other counseling recommendations.  Return in 4 weeks (on 04/01/2024) for Dyad patient, ob visit.   Lola Donnice HERO, MD

## 2024-04-01 ENCOUNTER — Ambulatory Visit: Admitting: Family Medicine

## 2024-04-01 ENCOUNTER — Encounter: Payer: Self-pay | Admitting: Family Medicine

## 2024-04-01 ENCOUNTER — Other Ambulatory Visit: Payer: Self-pay

## 2024-04-01 VITALS — BP 146/85 | HR 80 | Wt 177.3 lb

## 2024-04-01 DIAGNOSIS — Z3A25 25 weeks gestation of pregnancy: Secondary | ICD-10-CM

## 2024-04-01 DIAGNOSIS — R030 Elevated blood-pressure reading, without diagnosis of hypertension: Secondary | ICD-10-CM | POA: Insufficient documentation

## 2024-04-01 DIAGNOSIS — Z3492 Encounter for supervision of normal pregnancy, unspecified, second trimester: Secondary | ICD-10-CM

## 2024-04-01 NOTE — Patient Instructions (Signed)

## 2024-04-01 NOTE — Progress Notes (Signed)
 PRENATAL VISIT NOTE  Subjective:  Madison Jones is a 33 y.o. G1P0000 at [redacted]w[redacted]d being seen today for ongoing prenatal care.  She is currently monitored for the following issues for this low-risk pregnancy and has History of PCR DNA positive for HSV2; Supervision of low-risk pregnancy; and Fetal echogenic intracardiac focus on prenatal ultrasound on their problem list.  Patient reports upper abdominal pain that started last Sunday. 5/10 pain. Pain last for a couple of seconds. Pain occurs intermittently and does not occur daily. She has tried taking tylenol with good relief. Increased activity worsens pain..  Contractions: Irritability. Vag. Bleeding: None.  Movement: Present. Denies leaking of fluid.   Elevated BP today in office x 3. Patient denies having any headaches, visual changes, or increased swelling. Last home BP 11/12 and was 113/69.  The following portions of the patient's history were reviewed and updated as appropriate: allergies, current medications, past family history, past medical history, past social history, past surgical history and problem list.   Objective:   Vitals:   04/01/24 1350 04/01/24 1359  BP: (!) 148/99 (!) 144/88  Pulse: 78 73  Weight: 80.4 kg     Fetal Status:  Fetal Heart Rate (bpm): 157   Movement: Present    General: Alert, oriented and cooperative. Patient is in no acute distress.  Skin: Skin is warm and dry. No rash noted.   Cardiovascular: Normal heart rate noted  Respiratory: Normal respiratory effort, no problems with respiration noted  Abdomen: Soft, gravid, appropriate for gestational age.  Pain/Pressure: Absent     Pelvic: Cervical exam deferred        Extremities: Normal range of motion.  Edema: Trace  Mental Status: Normal mood and affect. Normal behavior. Normal judgment and thought content.      01/07/2024    5:22 PM 07/30/2021    8:54 AM 06/07/2020    9:59 AM  Depression screen PHQ 2/9  Decreased Interest 0 0 0  Down, Depressed,  Hopeless 0 0 0  PHQ - 2 Score 0 0 0  Altered sleeping 0    Tired, decreased energy 1    Change in appetite 0    Feeling bad or failure about yourself  0    Trouble concentrating 0    Moving slowly or fidgety/restless 0    Suicidal thoughts 0    PHQ-9 Score 1        Data saved with a previous flowsheet row definition        01/07/2024    5:22 PM  GAD 7 : Generalized Anxiety Score  Nervous, Anxious, on Edge 0  Control/stop worrying 0  Worry too much - different things 0  Trouble relaxing 0  Restless 0  Easily annoyed or irritable 0  Afraid - awful might happen 0  Total GAD 7 Score 0    Assessment and Plan:  Pregnancy: G1P0000 at [redacted]w[redacted]d 1. Encounter for supervision of low-risk pregnancy in second trimester (Primary) - Doing well, feeling regular and vigorous fetal movement  -BP elevated today -Baseline labs collected  2. [redacted] weeks gestation of pregnancy - Routine OB care  -Plan for GTT at next visit  3. Elevated blood pressure reading without diagnosis of hypertension -BP elevated today -Baseline labs collected -Patient advised to check blood pressures at home daily -Educated patient on warning signs with BP readings of 160/110 or greater, headache,  shortness of breath, chest pain, upper abdominal pain to report to MAU - CBC - Protein / Creatinine Ratio,  Urine - Comp Met (CMET)   Preterm labor symptoms and general obstetric precautions including but not limited to vaginal bleeding, contractions, leaking of fluid and fetal movement were reviewed in detail with the patient. Please refer to After Visit Summary for other counseling recommendations.   Return in about 4 weeks (around 04/29/2024) for DYAD- ROB.  Future Appointments  Date Time Provider Department Center  04/29/2024  8:15 AM Lola Donnice HERO, MD [facility]    Derrek JINNY Freund, NP Student

## 2024-04-02 ENCOUNTER — Ambulatory Visit: Payer: Self-pay | Admitting: Family Medicine

## 2024-04-02 DIAGNOSIS — R7401 Elevation of levels of liver transaminase levels: Secondary | ICD-10-CM | POA: Insufficient documentation

## 2024-04-02 LAB — COMPREHENSIVE METABOLIC PANEL WITH GFR
ALT: 42 IU/L — ABNORMAL HIGH (ref 0–32)
AST: 35 IU/L (ref 0–40)
Albumin: 3.5 g/dL — ABNORMAL LOW (ref 3.9–4.9)
Alkaline Phosphatase: 62 IU/L (ref 41–116)
BUN/Creatinine Ratio: 7 — ABNORMAL LOW (ref 9–23)
BUN: 4 mg/dL — ABNORMAL LOW (ref 6–20)
Bilirubin Total: 0.3 mg/dL (ref 0.0–1.2)
CO2: 22 mmol/L (ref 20–29)
Calcium: 9.2 mg/dL (ref 8.7–10.2)
Chloride: 101 mmol/L (ref 96–106)
Creatinine, Ser: 0.61 mg/dL (ref 0.57–1.00)
Globulin, Total: 3.2 g/dL (ref 1.5–4.5)
Glucose: 76 mg/dL (ref 70–99)
Potassium: 4.5 mmol/L (ref 3.5–5.2)
Sodium: 136 mmol/L (ref 134–144)
Total Protein: 6.7 g/dL (ref 6.0–8.5)
eGFR: 122 mL/min/1.73 (ref >59)

## 2024-04-02 LAB — CBC
Hematocrit: 38.4 % (ref 34.0–46.6)
Hemoglobin: 12.1 g/dL (ref 11.1–15.9)
MCH: 27.4 pg (ref 26.6–33.0)
MCHC: 31.5 g/dL (ref 31.5–35.7)
MCV: 87 fL (ref 79–97)
Platelets: 273 x10E3/uL (ref 150–450)
RBC: 4.42 x10E6/uL (ref 3.77–5.28)
RDW: 14.8 % (ref 11.7–15.4)
WBC: 8.3 x10E3/uL (ref 3.4–10.8)

## 2024-04-02 LAB — PROTEIN / CREATININE RATIO, URINE
Creatinine, Urine: 137.4 mg/dL
Protein, Ur: 11.3 mg/dL
Protein/Creat Ratio: 82 mg/g{creat} (ref 0–200)

## 2024-04-02 NOTE — Telephone Encounter (Signed)
 Needing revised restrictions note stating that leave is until after delivery.

## 2024-04-06 ENCOUNTER — Ambulatory Visit

## 2024-04-06 ENCOUNTER — Other Ambulatory Visit: Payer: Self-pay

## 2024-04-06 VITALS — BP 136/70 | HR 75 | Ht 62.0 in | Wt 175.3 lb

## 2024-04-06 DIAGNOSIS — Z3492 Encounter for supervision of normal pregnancy, unspecified, second trimester: Secondary | ICD-10-CM

## 2024-04-06 DIAGNOSIS — Z0130 Encounter for examination of blood pressure without abnormal findings: Secondary | ICD-10-CM

## 2024-04-06 NOTE — Progress Notes (Signed)
 Madison Jones is here today for a BP check. Florence is 26wks 4d. FHR-154  BP today 136/70 Patient denies headache, vision changes, and dizziness. Isaiah denies bleeding and pain. Reviewed signs and symptoms of pre-eclampsia and patient verbalized understanding. MAU Precautions reviewed and patient verbalized understanding. Patient states no further questions or concerns.   Devon, RN 04/06/24

## 2024-04-14 ENCOUNTER — Other Ambulatory Visit: Payer: Self-pay

## 2024-04-14 ENCOUNTER — Other Ambulatory Visit

## 2024-04-14 ENCOUNTER — Ambulatory Visit (INDEPENDENT_AMBULATORY_CARE_PROVIDER_SITE_OTHER): Admitting: Family Medicine

## 2024-04-14 VITALS — BP 129/84 | HR 72 | Wt 171.2 lb

## 2024-04-14 DIAGNOSIS — Z23 Encounter for immunization: Secondary | ICD-10-CM | POA: Diagnosis not present

## 2024-04-14 DIAGNOSIS — R030 Elevated blood-pressure reading, without diagnosis of hypertension: Secondary | ICD-10-CM | POA: Diagnosis not present

## 2024-04-14 DIAGNOSIS — O283 Abnormal ultrasonic finding on antenatal screening of mother: Secondary | ICD-10-CM | POA: Diagnosis not present

## 2024-04-14 DIAGNOSIS — Z3492 Encounter for supervision of normal pregnancy, unspecified, second trimester: Secondary | ICD-10-CM

## 2024-04-14 DIAGNOSIS — Z3A27 27 weeks gestation of pregnancy: Secondary | ICD-10-CM

## 2024-04-14 NOTE — Progress Notes (Signed)
 PRENATAL VISIT NOTE  Subjective:  Madison Jones is a 33 y.o. G1P0000 at [redacted]w[redacted]d being seen today for ongoing prenatal care.  She is currently monitored for the following issues for this low-risk pregnancy and has History of PCR DNA positive for HSV2; Supervision of low-risk pregnancy; Fetal echogenic intracardiac focus on prenatal ultrasound; Elevated blood pressure reading without diagnosis of hypertension; and Elevated ALT measurement on their problem list.  Patient reports no bleeding, no contractions, no cramping, and no leaking.  Contractions: Not present. Vag. Bleeding: None.  Movement: Present. Denies leaking of fluid.   The following portions of the patient's history were reviewed and updated as appropriate: allergies, current medications, past family history, past medical history, past social history, past surgical history and problem list.   Objective:   Vitals:   04/14/24 0907  BP: 129/84  Pulse: 72  Weight: 171 lb 3 oz (77.7 kg)    Fetal Status:  Fetal Heart Rate (bpm): 155   Movement: Present    General: Alert, oriented and cooperative. Patient is in no acute distress.  Skin: Skin is warm and dry. No rash noted.   Cardiovascular: Normal heart rate noted  Respiratory: Normal respiratory effort, no problems with respiration noted  Abdomen: Soft, gravid, appropriate for gestational age.  Pain/Pressure: Absent     Pelvic: Cervical exam deferred        Extremities: Normal range of motion.  Edema: Trace (BIL feet)  Mental Status: Normal mood and affect. Normal behavior. Normal judgment and thought content.      01/07/2024    5:22 PM 07/30/2021    8:54 AM 06/07/2020    9:59 AM  Depression screen PHQ 2/9  Decreased Interest 0 0 0  Down, Depressed, Hopeless 0 0 0  PHQ - 2 Score 0 0 0  Altered sleeping 0    Tired, decreased energy 1    Change in appetite 0    Feeling bad or failure about yourself  0    Trouble concentrating 0    Moving slowly or fidgety/restless 0     Suicidal thoughts 0    PHQ-9 Score 1        Data saved with a previous flowsheet row definition        01/07/2024    5:22 PM  GAD 7 : Generalized Anxiety Score  Nervous, Anxious, on Edge 0  Control/stop worrying 0  Worry too much - different things 0  Trouble relaxing 0  Restless 0  Easily annoyed or irritable 0  Afraid - awful might happen 0  Total GAD 7 Score 0    Assessment and Plan:  Pregnancy: G1P0000 at [redacted]w[redacted]d 1. Encounter for supervision of low-risk pregnancy in second trimester (Primary) FHR and BP appropriate today - Tdap vaccine greater than or equal to 7yo IM  2. Elevated blood pressure reading without diagnosis of hypertension Blood pressure mildly elevated at previous visit.  Within normal limits today.  No signs of preeclampsia.  Will continue to monitor.  3. Fetal echogenic intracardiac focus on prenatal ultrasound  4. [redacted] weeks gestation of pregnancy 28-week labs and GTT collected today  Preterm labor symptoms and general obstetric precautions including but not limited to vaginal bleeding, contractions, leaking of fluid and fetal movement were reviewed in detail with the patient. Please refer to After Visit Summary for other counseling recommendations.   No follow-ups on file.  Future Appointments  Date Time Provider Department Center  04/14/2024  9:40 AM [facility] [place] [place]  04/29/2024  8:15 AM Lola, Donnice HERO, MD [facility]    Norleen LULLA Rover, MD

## 2024-04-15 LAB — CBC
Hematocrit: 38.5 % (ref 34.0–46.6)
Hemoglobin: 12.2 g/dL (ref 11.1–15.9)
MCH: 27.4 pg (ref 26.6–33.0)
MCHC: 31.7 g/dL (ref 31.5–35.7)
MCV: 86 fL (ref 79–97)
Platelets: 256 x10E3/uL (ref 150–450)
RBC: 4.46 x10E6/uL (ref 3.77–5.28)
RDW: 14.1 % (ref 11.7–15.4)
WBC: 6.5 x10E3/uL (ref 3.4–10.8)

## 2024-04-15 LAB — GLUCOSE TOLERANCE, 2 HOURS W/ 1HR
Glucose, 1 hour: 107 mg/dL (ref 70–179)
Glucose, 2 hour: 82 mg/dL (ref 70–152)
Glucose, Fasting: 71 mg/dL (ref 70–91)

## 2024-04-15 LAB — HIV ANTIBODY (ROUTINE TESTING W REFLEX): HIV Screen 4th Generation wRfx: NONREACTIVE

## 2024-04-15 LAB — SYPHILIS: RPR W/REFLEX TO RPR TITER AND TREPONEMAL ANTIBODIES, TRADITIONAL SCREENING AND DIAGNOSIS ALGORITHM: RPR Ser Ql: NONREACTIVE

## 2024-04-16 ENCOUNTER — Ambulatory Visit: Payer: Self-pay | Admitting: Family Medicine

## 2024-04-16 DIAGNOSIS — Z3493 Encounter for supervision of normal pregnancy, unspecified, third trimester: Secondary | ICD-10-CM

## 2024-04-29 ENCOUNTER — Ambulatory Visit: Admitting: Family Medicine

## 2024-04-29 ENCOUNTER — Encounter: Payer: Self-pay | Admitting: Family Medicine

## 2024-04-29 ENCOUNTER — Other Ambulatory Visit: Payer: Self-pay

## 2024-04-29 VITALS — BP 133/86 | HR 82 | Wt 182.5 lb

## 2024-04-29 DIAGNOSIS — R030 Elevated blood-pressure reading, without diagnosis of hypertension: Secondary | ICD-10-CM

## 2024-04-29 DIAGNOSIS — O283 Abnormal ultrasonic finding on antenatal screening of mother: Secondary | ICD-10-CM

## 2024-04-29 DIAGNOSIS — Z3A29 29 weeks gestation of pregnancy: Secondary | ICD-10-CM

## 2024-04-29 DIAGNOSIS — Z3493 Encounter for supervision of normal pregnancy, unspecified, third trimester: Secondary | ICD-10-CM

## 2024-04-29 DIAGNOSIS — R7401 Elevation of levels of liver transaminase levels: Secondary | ICD-10-CM

## 2024-04-29 NOTE — Patient Instructions (Signed)

## 2024-04-29 NOTE — Progress Notes (Signed)
° °  Subjective:  Madison Jones is a 33 y.o. G1P0000 at [redacted]w[redacted]d being seen today for ongoing prenatal care.  She is currently monitored for the following issues for this low-risk pregnancy and has History of PCR DNA positive for HSV2; Supervision of low-risk pregnancy; Fetal echogenic intracardiac focus on prenatal ultrasound; Elevated blood pressure reading without diagnosis of hypertension; and Elevated ALT measurement on their problem list.  Patient reports no complaints.  Contractions: Irritability. Vag. Bleeding: None.  Movement: Present. Denies leaking of fluid.   The following portions of the patient's history were reviewed and updated as appropriate: allergies, current medications, past family history, past medical history, past social history, past surgical history and problem list. Problem list updated.  Objective:   Vitals:   04/29/24 0820  BP: 133/86  Pulse: 82  Weight: 182 lb 8 oz (82.8 kg)    Fetal Status: Fetal Heart Rate (bpm): 147 Fundal Height: 29 cm Movement: Present     General:  Alert, oriented and cooperative. Patient is in no acute distress.  Skin: Skin is warm and dry. No rash noted.   Cardiovascular: Normal heart rate noted  Respiratory: Normal respiratory effort, no problems with respiration noted  Abdomen: Soft, gravid, appropriate for gestational age. Pain/Pressure: Absent     Pelvic: Vag. Bleeding: None     Cervical exam deferred        Extremities: Normal range of motion.  Edema: Trace  Mental Status: Normal mood and affect. Normal behavior. Normal judgment and thought content.   Urinalysis:      Assessment and Plan:  Pregnancy: G1P0000 at [redacted]w[redacted]d  1. Encounter for supervision of low-risk pregnancy in third trimester (Primary) BP and FHR normal FH appropriate Discussed RSV vaccine timing, small absolute risk increase of preterm delivery (4>5%) in original study. After discussing plan to give around 34 weeks  2. Elevated blood pressure reading without  diagnosis of hypertension Elevated at 25 wk visit, normotensive since then  3. Elevated ALT measurement Checked after 25 wk visit, borderline elevated ALT but other labs normal  4. Fetal echogenic intracardiac focus on prenatal ultrasound Low risk NIPT, normal US   Preterm labor symptoms and general obstetric precautions including but not limited to vaginal bleeding, contractions, leaking of fluid and fetal movement were reviewed in detail with the patient. Please refer to After Visit Summary for other counseling recommendations.  Return in 2 weeks (on 05/13/2024) for Dyad patient, ob visit.   Lola Donnice HERO, MD

## 2024-05-03 ENCOUNTER — Ambulatory Visit: Admitting: General Practice

## 2024-05-03 ENCOUNTER — Other Ambulatory Visit: Payer: Self-pay

## 2024-05-03 ENCOUNTER — Encounter: Payer: Self-pay | Admitting: General Practice

## 2024-05-03 ENCOUNTER — Other Ambulatory Visit (HOSPITAL_COMMUNITY)
Admission: RE | Admit: 2024-05-03 | Discharge: 2024-05-03 | Disposition: A | Discharge status: Home / Self Care | Source: Ambulatory Visit | Attending: Family Medicine | Admitting: Family Medicine

## 2024-05-03 VITALS — BP 124/72 | HR 82 | Ht 62.0 in | Wt 184.0 lb

## 2024-05-03 DIAGNOSIS — N898 Other specified noninflammatory disorders of vagina: Secondary | ICD-10-CM | POA: Diagnosis present

## 2024-05-03 DIAGNOSIS — O26893 Other specified pregnancy related conditions, third trimester: Secondary | ICD-10-CM | POA: Insufficient documentation

## 2024-05-03 DIAGNOSIS — Z3A30 30 weeks gestation of pregnancy: Secondary | ICD-10-CM

## 2024-05-03 NOTE — Progress Notes (Cosign Needed Addendum)
 Patient presents to office today reporting a lot of thick milky discharge since yesterday. No itching or odor. Patient was instructed in self swab & specimen collected. She reports good movement from baby but has noticed an increase in braxton hicks. In the past she has noticed 2 a day a couple times a week but for the past couple of days she has 7-8 a day. She states rest does seem to help. Recommended resting when able and trying to slow down some as well as good hydration. Discussed going to MAU for evaluation if she notices 6-7 a hour. Patient verbalized understanding & will await test results.   Elenor DEL RN MSN 05/03/2024

## 2024-05-04 ENCOUNTER — Ambulatory Visit: Payer: Self-pay | Admitting: Family Medicine

## 2024-05-04 DIAGNOSIS — Z0289 Encounter for other administrative examinations: Secondary | ICD-10-CM

## 2024-05-04 LAB — CERVICOVAGINAL ANCILLARY ONLY
Bacterial Vaginitis (gardnerella): NEGATIVE
Candida Glabrata: NEGATIVE
Candida Vaginitis: POSITIVE — AB
Chlamydia: NEGATIVE
Comment: NEGATIVE
Comment: NEGATIVE
Comment: NEGATIVE
Comment: NEGATIVE
Comment: NEGATIVE
Comment: NORMAL
Neisseria Gonorrhea: NEGATIVE
Trichomonas: NEGATIVE

## 2024-05-07 MED ORDER — MICONAZOLE NITRATE 2 % VA CREA: 1.0000 | TOPICAL_CREAM | Freq: Every day | VAGINAL | 0 refills | Status: AC

## 2024-05-14 ENCOUNTER — Ambulatory Visit: Admitting: Family Medicine

## 2024-05-14 ENCOUNTER — Other Ambulatory Visit: Payer: Self-pay

## 2024-05-14 VITALS — BP 133/82 | HR 73 | Wt 189.5 lb

## 2024-05-14 DIAGNOSIS — R7401 Elevation of levels of liver transaminase levels: Secondary | ICD-10-CM

## 2024-05-14 DIAGNOSIS — Z3A32 32 weeks gestation of pregnancy: Secondary | ICD-10-CM

## 2024-05-14 DIAGNOSIS — Z3493 Encounter for supervision of normal pregnancy, unspecified, third trimester: Secondary | ICD-10-CM

## 2024-05-14 DIAGNOSIS — R030 Elevated blood-pressure reading, without diagnosis of hypertension: Secondary | ICD-10-CM

## 2024-05-14 DIAGNOSIS — O283 Abnormal ultrasonic finding on antenatal screening of mother: Secondary | ICD-10-CM

## 2024-05-14 NOTE — Progress Notes (Signed)
 "  PRENATAL VISIT NOTE  Subjective:  Madison Jones is a 34 y.o. G1P0000 at [redacted]w[redacted]d being seen today for ongoing prenatal care.  She is currently monitored for the following issues for this low-risk pregnancy and has History of PCR DNA positive for HSV2; Supervision of low-risk pregnancy; Fetal echogenic intracardiac focus on prenatal ultrasound; Elevated blood pressure reading without diagnosis of hypertension; and Elevated ALT measurement on their problem list.  Patient reports no bleeding, no contractions, no cramping, and no leaking.  Contractions: Irritability. Vag. Bleeding: None.  Movement: Present. Denies leaking of fluid.   The following portions of the patient's history were reviewed and updated as appropriate: allergies, current medications, past family history, past medical history, past social history, past surgical history and problem list.   Objective:   Vitals:   05/14/24 0857  BP: 133/82  Pulse: 73  Weight: 189 lb 8 oz (86 kg)    Fetal Status:  Fetal Heart Rate (bpm): 156   Movement: Present    General: Alert, oriented and cooperative. Patient is in no acute distress.  Skin: Skin is warm and dry. No rash noted.   Cardiovascular: Normal heart rate noted  Respiratory: Normal respiratory effort, no problems with respiration noted  Abdomen: Soft, gravid, appropriate for gestational age.  Pain/Pressure: Absent     Pelvic: Cervical exam deferred        Extremities: Normal range of motion.  Edema: Trace  Mental Status: Normal mood and affect. Normal behavior. Normal judgment and thought content.      01/07/2024    5:22 PM 07/30/2021    8:54 AM 06/07/2020    9:59 AM  Depression screen PHQ 2/9  Decreased Interest 0 0 0  Down, Depressed, Hopeless 0 0 0  PHQ - 2 Score 0 0 0  Altered sleeping 0    Tired, decreased energy 1    Change in appetite 0    Feeling bad or failure about yourself  0    Trouble concentrating 0    Moving slowly or fidgety/restless 0    Suicidal  thoughts 0    PHQ-9 Score 1        Data saved with a previous flowsheet row definition        01/07/2024    5:22 PM  GAD 7 : Generalized Anxiety Score  Nervous, Anxious, on Edge 0  Control/stop worrying 0  Worry too much - different things 0  Trouble relaxing 0  Restless 0  Easily annoyed or irritable 0  Afraid - awful might happen 0  Total GAD 7 Score 0    Assessment and Plan:  Pregnancy: G1P0000 at [redacted]w[redacted]d 1. Encounter for supervision of low-risk pregnancy in third trimester (Primary) FHR and BP appropriate today  2. Elevated blood pressure reading without diagnosis of hypertension Blood pressure within normal limits today.  She is having increased swelling bilaterally but resolves with elevation.  No headaches or changes in vision or right upper quadrant pain.  Patient has home blood pressure cuff and is checking regularly.  Counseled on preeclampsia precautions.  3. Fetal echogenic intracardiac focus on prenatal ultrasound Low risk nips with otherwise normal ultrasound  4. Elevated ALT measurement Elevated at 25-week visit and was just borderline.  Otherwise normal labs.  5. [redacted] weeks gestation of pregnancy   Preterm labor symptoms and general obstetric precautions including but not limited to vaginal bleeding, contractions, leaking of fluid and fetal movement were reviewed in detail with the patient. Please refer to After Visit  Summary for other counseling recommendations.   No follow-ups on file.  Future Appointments  Date Time Provider Department Center  05/27/2024 11:15 AM Lola Donnice HERO, MD [facility]  06/03/2024  8:15 AM Lola Donnice HERO, MD [facility]  06/10/2024  8:15 AM Lola Donnice HERO, MD [facility]  06/17/2024  8:55 AM Lola Donnice HERO, MD [facility]  06/24/2024  8:15 AM [facility] MOM BABY DYAD PROVIDER [facility]  07/01/2024  8:15 AM [facility] MOM BABY DYAD PROVIDER [facility]  07/08/2024  8:15 AM [facility] MOM BABY DYAD PROVIDER [facility]    Norleen LULLA Rover, MD  "

## 2024-05-27 ENCOUNTER — Other Ambulatory Visit: Payer: Self-pay

## 2024-05-27 ENCOUNTER — Ambulatory Visit: Admitting: Family Medicine

## 2024-05-27 ENCOUNTER — Encounter: Payer: Self-pay | Admitting: Family Medicine

## 2024-05-27 VITALS — BP 136/86 | HR 97 | Wt 192.2 lb

## 2024-05-27 DIAGNOSIS — O26843 Uterine size-date discrepancy, third trimester: Secondary | ICD-10-CM

## 2024-05-27 DIAGNOSIS — R030 Elevated blood-pressure reading, without diagnosis of hypertension: Secondary | ICD-10-CM | POA: Diagnosis not present

## 2024-05-27 DIAGNOSIS — Z3493 Encounter for supervision of normal pregnancy, unspecified, third trimester: Secondary | ICD-10-CM

## 2024-05-27 DIAGNOSIS — Z3A33 33 weeks gestation of pregnancy: Secondary | ICD-10-CM | POA: Diagnosis not present

## 2024-05-27 DIAGNOSIS — R7401 Elevation of levels of liver transaminase levels: Secondary | ICD-10-CM

## 2024-05-27 DIAGNOSIS — O283 Abnormal ultrasonic finding on antenatal screening of mother: Secondary | ICD-10-CM

## 2024-05-27 NOTE — Progress Notes (Signed)
" ° °  Subjective:  Madison Jones is a 34 y.o. G1P0000 at [redacted]w[redacted]d being seen today for ongoing prenatal care.  She is currently monitored for the following issues for this low-risk pregnancy and has History of PCR DNA positive for HSV2; Supervision of low-risk pregnancy; Fetal echogenic intracardiac focus on prenatal ultrasound; Elevated blood pressure reading without diagnosis of hypertension; Elevated ALT measurement; and Significant discrepancy between uterine size and clinical dates, antepartum, third trimester on their problem list.  Patient reports no complaints.  Contractions: Not present. Vag. Bleeding: None.  Movement: Present. Denies leaking of fluid.   The following portions of the patient's history were reviewed and updated as appropriate: allergies, current medications, past family history, past medical history, past social history, past surgical history and problem list. Problem list updated.  Objective:   Vitals:   05/27/24 1125  BP: 136/86  Pulse: 97  Weight: 192 lb 3 oz (87.2 kg)    Fetal Status: Fetal Heart Rate (bpm): 148 Fundal Height: 36 cm Movement: Present     General:  Alert, oriented and cooperative. Patient is in no acute distress.  Skin: Skin is warm and dry. No rash noted.   Cardiovascular: Normal heart rate noted  Respiratory: Normal respiratory effort, no problems with respiration noted  Abdomen: Soft, gravid, appropriate for gestational age. Pain/Pressure: Present     Pelvic: Vag. Bleeding: None     Cervical exam deferred        Extremities: Normal range of motion.  Edema: None  Mental Status: Normal mood and affect. Normal behavior. Normal judgment and thought content.   Urinalysis:      Assessment and Plan:  Pregnancy: G1P0000 at [redacted]w[redacted]d  1. Encounter for supervision of low-risk pregnancy in third trimester (Primary) BP and FHR normal FH measuring large, almost 3 cm ahead, will send for follow up growth scan RSV next visit  2. Elevated blood pressure  reading without diagnosis of hypertension Elevated at 25 wk visit, normotensive since then   3. Fetal echogenic intracardiac focus on prenatal ultrasound Low risk NIPT, normal US   4. Elevated ALT measurement Checked after 25 wk visit, borderline elevated ALT but other labs normal   Preterm labor symptoms and general obstetric precautions including but not limited to vaginal bleeding, contractions, leaking of fluid and fetal movement were reviewed in detail with the patient. Please refer to After Visit Summary for other counseling recommendations.  Return in 2 weeks (on 06/10/2024) for Dyad patient, ob visit.   Lola Donnice HERO, MD "

## 2024-05-27 NOTE — Patient Instructions (Signed)

## 2024-05-31 ENCOUNTER — Ambulatory Visit: Attending: Obstetrics and Gynecology | Admitting: Maternal & Fetal Medicine

## 2024-05-31 ENCOUNTER — Ambulatory Visit

## 2024-05-31 VITALS — BP 128/70 | HR 73

## 2024-05-31 DIAGNOSIS — Z3A34 34 weeks gestation of pregnancy: Secondary | ICD-10-CM | POA: Diagnosis not present

## 2024-05-31 DIAGNOSIS — O26843 Uterine size-date discrepancy, third trimester: Secondary | ICD-10-CM | POA: Insufficient documentation

## 2024-05-31 DIAGNOSIS — O403XX0 Polyhydramnios, third trimester, not applicable or unspecified: Secondary | ICD-10-CM | POA: Insufficient documentation

## 2024-05-31 DIAGNOSIS — Z369 Encounter for antenatal screening, unspecified: Secondary | ICD-10-CM | POA: Diagnosis present

## 2024-05-31 DIAGNOSIS — O283 Abnormal ultrasonic finding on antenatal screening of mother: Secondary | ICD-10-CM

## 2024-05-31 DIAGNOSIS — Z3493 Encounter for supervision of normal pregnancy, unspecified, third trimester: Secondary | ICD-10-CM

## 2024-05-31 DIAGNOSIS — O358XX0 Maternal care for other (suspected) fetal abnormality and damage, not applicable or unspecified: Secondary | ICD-10-CM | POA: Insufficient documentation

## 2024-05-31 NOTE — Progress Notes (Signed)
 "  Patient information  Patient Name: Madison Jones  Patient MRN:   [id number]  Referring practice: MFM Referring Provider: [facility])  Problem List   Patient Active Problem List   Diagnosis Date Noted   Polyhydramnios affecting pregnancy in third trimester 05/31/2024   Significant discrepancy between uterine size and clinical dates, antepartum, third trimester 05/27/2024   Elevated ALT measurement 04/02/2024   Elevated blood pressure reading without diagnosis of hypertension 04/01/2024   Fetal echogenic intracardiac focus on prenatal ultrasound 03/02/2024   Supervision of low-risk pregnancy 12/23/2023   History of PCR DNA positive for HSV2 11/01/2021    Maternal Fetal medicine Consult  Roselyne Percifield is a 34 y.o. G1P0000 at [redacted]w[redacted]d here for ultrasound and consultation. Tonya Sane is doing well today with no acute concerns. Today we focused on the following:   The patient was referred for evaluation due to a size-date discrepancy. Todays ultrasound demonstrates an estimated fetal weight at the 34th percentile overall, consistent with appropriate fetal growth. However, there is evidence of mild polyhydramnios. We discussed the potential etiologies and clinical implications of polyhydramnios in detail.  Given the reassuring fetal growth and absence of additional concerning findings, weekly antenatal testing is not indicated at this time. A repeat growth ultrasound with reassessment of the amniotic fluid volume is recommended in four weeks. If the primary OB provider prefers antenatal testing, this may be considered should additional indications arise.  We reviewed potential clinical concerns associated with polyhydramnios, including undiagnosed gestational diabetes, increased uterine distention with a higher risk of contractions and preterm labor, fetal malpresentation, and an unstable lie. Given the normal fetal growth pattern and lack of additional risk  factors, routine blood glucose monitoring is not indicated at this time. Also discussed the rare possibility of a genetic or structural problem causing the poly.  Labor precautions were reviewed, and the patient was instructed to return in four weeks for a follow-up growth ultrasound. She verbalizes understanding and agrees with the plan.  Sonographic findings Single intrauterine pregnancy at 34w 3d.  Fetal cardiac activity:  Observed and appears normal. Presentation: Cephalic. Interval fetal anatomy appears normal. Fetal biometry shows the estimated fetal weight of 5 lb 2 oz,  2333g (33%). Amniotic fluid volume: Polyhydramnios. AFI: 25.63cm.  MVP: 8.65 cm. Placenta: Anterior.  There are limitations of prenatal ultrasound such as the inability to detect certain abnormalities due to poor visualization. Various factors such as fetal position, gestational age and maternal body habitus may increase the difficulty in visualizing the fetal anatomy.    RECOMMENDATIONS -Repeat growth ultrasound with amniotic fluid assessment in four weeks -No indication for weekly antenatal testing at this time -Antenatal testing may be considered if additional indications arise -No need for blood glucose testing at this time given normal growth -Review labor precautions, including contractions, leakage of fluid, or decreased fetal movement -Continue routine prenatal care with close attention to symptoms suggestive of preterm labor  The patient had time to ask questions that were answered to her satisfaction.  She verbalized understanding and agrees to proceed with the plan above.   I spent 20 minutes reviewing the patients chart, including labs and images as well as counseling the patient about her medical conditions. Greater than 50% of the time was spent in direct face-to-face patient counseling.  Delora Smaller  MFM, [place]   05/31/2024  10:53 AM   Review of Systems: A review of systems was performed  and was negative except per  HPI   Vitals and Physical Exam    05/31/2024   10:17 AM 05/27/2024   11:25 AM 05/14/2024    8:57 AM  Vitals with BMI  Weight  192 lbs 3 oz 189 lbs 8 oz  BMI   34.65  Systolic 128 136 866  Diastolic 70 86 82  Pulse 73 97 73    Sitting comfortably on the sonogram table Nonlabored breathing Normal rate and rhythm Abdomen is nontender  Past pregnancies OB History  Gravida Para Term Preterm AB Living  1 0 0 0 0 0  SAB IAB Ectopic Multiple Live Births  0 0 0 0 0    # Outcome Date GA Lbr Len/2nd Weight Sex Type Anes PTL Lv  1 Current              Future Appointments  Date Time Provider Department Center  06/10/2024  8:15 AM Lola Donnice HERO, MD [facility]  06/17/2024  8:55 AM Lola Donnice HERO, MD [facility]  06/24/2024  8:15 AM Lola Donnice HERO, MD [facility]  07/01/2024  8:15 AM Lola Donnice HERO, MD [facility]  07/08/2024  8:15 AM Lola Donnice HERO, MD [facility]      "

## 2024-06-03 ENCOUNTER — Encounter: Admitting: Family Medicine

## 2024-06-10 ENCOUNTER — Ambulatory Visit: Admitting: Family Medicine

## 2024-06-10 ENCOUNTER — Encounter: Payer: Self-pay | Admitting: Family Medicine

## 2024-06-10 ENCOUNTER — Other Ambulatory Visit (HOSPITAL_COMMUNITY)
Admission: RE | Admit: 2024-06-10 | Discharge: 2024-06-10 | Disposition: A | Source: Ambulatory Visit | Attending: Family Medicine | Admitting: Family Medicine

## 2024-06-10 ENCOUNTER — Other Ambulatory Visit: Payer: Self-pay

## 2024-06-10 VITALS — BP 135/84 | Wt 194.0 lb

## 2024-06-10 DIAGNOSIS — R7401 Elevation of levels of liver transaminase levels: Secondary | ICD-10-CM | POA: Diagnosis not present

## 2024-06-10 DIAGNOSIS — R030 Elevated blood-pressure reading, without diagnosis of hypertension: Secondary | ICD-10-CM | POA: Diagnosis not present

## 2024-06-10 DIAGNOSIS — Z3493 Encounter for supervision of normal pregnancy, unspecified, third trimester: Secondary | ICD-10-CM | POA: Diagnosis present

## 2024-06-10 DIAGNOSIS — Z2911 Encounter for prophylactic immunotherapy for respiratory syncytial virus (RSV): Secondary | ICD-10-CM | POA: Diagnosis not present

## 2024-06-10 DIAGNOSIS — O283 Abnormal ultrasonic finding on antenatal screening of mother: Secondary | ICD-10-CM

## 2024-06-10 DIAGNOSIS — Z3A35 35 weeks gestation of pregnancy: Secondary | ICD-10-CM | POA: Diagnosis not present

## 2024-06-10 DIAGNOSIS — O403XX0 Polyhydramnios, third trimester, not applicable or unspecified: Secondary | ICD-10-CM

## 2024-06-10 DIAGNOSIS — O26843 Uterine size-date discrepancy, third trimester: Secondary | ICD-10-CM

## 2024-06-10 NOTE — Patient Instructions (Signed)

## 2024-06-10 NOTE — Progress Notes (Signed)
" ° °  Subjective:  Madison Jones is a 34 y.o. G1P0000 at [redacted]w[redacted]d being seen today for ongoing prenatal care.  She is currently monitored for the following issues for this low-risk pregnancy and has History of PCR DNA positive for HSV2; Supervision of low-risk pregnancy; Fetal echogenic intracardiac focus on prenatal ultrasound; Elevated blood pressure reading without diagnosis of hypertension; Elevated ALT measurement; Significant discrepancy between uterine size and clinical dates, antepartum, third trimester; and Polyhydramnios affecting pregnancy in third trimester on their problem list.  Patient reports no complaints.  Contractions: Irritability. Vag. Bleeding: None.  Movement: Present. Denies leaking of fluid.   The following portions of the patient's history were reviewed and updated as appropriate: allergies, current medications, past family history, past medical history, past social history, past surgical history and problem list. Problem list updated.  Objective:   Vitals:   06/10/24 0833  BP: 135/84  Weight: 194 lb (88 kg)    Fetal Status: Fetal Heart Rate (bpm): 148   Movement: Present     General:  Alert, oriented and cooperative. Patient is in no acute distress.  Skin: Skin is warm and dry. No rash noted.   Cardiovascular: Normal heart rate noted  Respiratory: Normal respiratory effort, no problems with respiration noted  Abdomen: Soft, gravid, appropriate for gestational age. Pain/Pressure: Present     Pelvic: Vag. Bleeding: None     Cervical exam deferred        Extremities: Normal range of motion.  Edema: None  Mental Status: Normal mood and affect. Normal behavior. Normal judgment and thought content.   Urinalysis:      Assessment and Plan:  Pregnancy: G1P0000 at [redacted]w[redacted]d  1. Encounter for supervision of low-risk pregnancy in third trimester (Primary) BP and FHR normal Swabs collected today Given letter for work at her request, advised to do FMLA asap as well  2.  Elevated blood pressure reading without diagnosis of hypertension Elevated at 25 wk visit, normotensive since then    3. Fetal echogenic intracardiac focus on prenatal ultrasound Low risk NIPT, normal US    4. Elevated ALT measurement Checked after 25 wk visit, borderline elevated ALT but other labs normal   5. Significant discrepancy between uterine size and clinical dates, antepartum, third trimester Growth US  05/31/2024 with normal growth EFW 33%, 2333g, AC 52%, AFI 25.6 cm with MVP 8.6 cm, mild polyhydramnios Repeat growth scan scheduled in four weeks, antenatal testing not indicated  6. Polyhydramnios affecting pregnancy in third trimester   Preterm labor symptoms and general obstetric precautions including but not limited to vaginal bleeding, contractions, leaking of fluid and fetal movement were reviewed in detail with the patient. Please refer to After Visit Summary for other counseling recommendations.  Return in 1 week (on 06/17/2024) for Dyad patient, ob visit.   Lola Donnice HERO, MD "

## 2024-06-11 LAB — GC/CHLAMYDIA PROBE AMP (~~LOC~~) NOT AT ARMC
Chlamydia: NEGATIVE
Comment: NEGATIVE
Comment: NORMAL
Neisseria Gonorrhea: NEGATIVE

## 2024-06-15 LAB — CULTURE, BETA STREP (GROUP B ONLY): Strep Gp B Culture: NEGATIVE

## 2024-06-17 ENCOUNTER — Ambulatory Visit: Admitting: Family Medicine

## 2024-06-17 ENCOUNTER — Other Ambulatory Visit: Payer: Self-pay

## 2024-06-17 VITALS — BP 135/87 | HR 80 | Wt 194.3 lb

## 2024-06-17 DIAGNOSIS — R7401 Elevation of levels of liver transaminase levels: Secondary | ICD-10-CM

## 2024-06-17 DIAGNOSIS — R030 Elevated blood-pressure reading, without diagnosis of hypertension: Secondary | ICD-10-CM

## 2024-06-17 DIAGNOSIS — Z3493 Encounter for supervision of normal pregnancy, unspecified, third trimester: Secondary | ICD-10-CM

## 2024-06-17 DIAGNOSIS — O403XX0 Polyhydramnios, third trimester, not applicable or unspecified: Secondary | ICD-10-CM

## 2024-06-17 DIAGNOSIS — O283 Abnormal ultrasonic finding on antenatal screening of mother: Secondary | ICD-10-CM

## 2024-06-17 DIAGNOSIS — O26843 Uterine size-date discrepancy, third trimester: Secondary | ICD-10-CM

## 2024-06-17 NOTE — Progress Notes (Signed)
" ° °  Subjective:  Madison Jones is a 34 y.o. G1P0000 at 109w6d being seen today for ongoing prenatal care.  She is currently monitored for the following issues for this high-risk pregnancy and has History of PCR DNA positive for HSV2; Supervision of low-risk pregnancy; Fetal echogenic intracardiac focus on prenatal ultrasound; Elevated blood pressure reading without diagnosis of hypertension; Elevated ALT measurement; Significant discrepancy between uterine size and clinical dates, antepartum, third trimester; and Polyhydramnios affecting pregnancy in third trimester on their problem list.  Patient reports no complaints.  Contractions: Irritability. Vag. Bleeding: None.  Movement: Present. Denies leaking of fluid.   The following portions of the patient's history were reviewed and updated as appropriate: allergies, current medications, past family history, past medical history, past social history, past surgical history and problem list. Problem list updated.  Objective:   Vitals:   06/17/24 0855  BP: 135/87  Pulse: 80  Weight: 194 lb 4.8 oz (88.1 kg)    Fetal Status: Fetal Heart Rate (bpm): 150   Movement: Present     General:  Alert, oriented and cooperative. Patient is in no acute distress.  Skin: Skin is warm and dry. No rash noted.   Cardiovascular: Normal heart rate noted  Respiratory: Normal respiratory effort, no problems with respiration noted  Abdomen: Soft, gravid, appropriate for gestational age. Pain/Pressure: Present     Pelvic: Vag. Bleeding: None     Cervical exam deferred        Extremities: Normal range of motion.  Edema: None  Mental Status: Normal mood and affect. Normal behavior. Normal judgment and thought content.   Urinalysis:      Assessment and Plan:  Pregnancy: G1P0000 at [redacted]w[redacted]d  1. Encounter for supervision of low-risk pregnancy in third trimester (Primary) BP and FHR normal  2. Elevated blood pressure reading without diagnosis of hypertension Elevated  at 25 wk visit, normotensive since then    3. Fetal echogenic intracardiac focus on prenatal ultrasound Low risk NIPT, normal US    4. Elevated ALT measurement Checked after 25 wk visit, borderline elevated ALT but other labs normal    5. Significant discrepancy between uterine size and clinical dates, antepartum, third trimester Growth US  05/31/2024 with normal growth EFW 33%, 2333g, AC 52%, AFI 25.6 cm with MVP 8.6 cm, mild polyhydramnios Repeat growth scan scheduled in four weeks, antenatal testing not indicated  6. Polyhydramnios affecting pregnancy in third trimester   Preterm labor symptoms and general obstetric precautions including but not limited to vaginal bleeding, contractions, leaking of fluid and fetal movement were reviewed in detail with the patient. Please refer to After Visit Summary for other counseling recommendations.  Return in 1 week (on 06/24/2024) for Dyad patient, ob visit.   Lola Donnice HERO, MD "

## 2024-06-17 NOTE — Patient Instructions (Signed)
 Birth Control: The Options Birth control is also called contraception. Birth control prevents pregnancy. There are many types of birth control. Work with your health care provider to find the best option for you. Birth control that uses hormones These types of birth control have hormones in them to prevent pregnancy. Birth control implant This is a small tube that is put into the skin of your arm. The tube can stay in for up to 3 years. Birth control shot These are shots you get every 3 months. Birth control pills This is a pill you take every day. You need to take it at the same time each day. Birth control patch This is a patch that you put on your skin. You change it 1 time each week for 3 weeks. After that, you take the patch off for 1 week. Vaginal ring  This is a soft plastic ring that you put in your vagina. The ring is left in for 3 weeks. Then, you take it out for 1 week. Then, you put a new ring in. Barrier methods  Female condom This is a thin covering that you put on the penis before sex. The condom is thrown away after sex. Female condom This is a soft, loose covering that you put in the vagina before sex. The condom is thrown away after sex. Diaphragm A diaphragm is a soft barrier that is shaped like a bowl. It must be made to fit your body. You put it in the vagina before sex with a chemical that kills sperm called spermicide. A diaphragm should be left in the vagina for 6-8 hours after sex and taken out within 24 hours. You need to replace a diaphragm: Every 1-2 years. After giving birth. After gaining more than 15 lb (6.8 kg). If you have surgery on your pelvis. Cervical cap This is a small, soft cup that fits over the cervix. The cervix is the lowest part of the uterus. It's put in the vagina before sex, along with spermicide. The cap must be made for you. The cap should be left in for 6-8 hours after sex. It is taken out within 48 hours. A cervical cap must be  prescribed and fit to your body by a provider. It should be replaced every 2 years. Sponge This is a small sponge that is put into the vagina before sex. It must be left in for at least 6 hours after sex. It must be taken out within 30 hours and thrown away. Spermicides These are chemicals that kill or stop sperm from getting into the uterus. They may be a pill, cream, jelly, or foam that you put into your vagina. They should be used at least 10-15 minutes before sex. Intrauterine device An intrauterine device (IUD) is a device that's put in the uterus by a provider. There are two types: Hormone IUD. This kind can stay in for 3-5 years. Copper IUD. This kind can stay in for 10 years. Permanent birth control Female tubal ligation This is surgery to block the fallopian tubes. Female sterilization This is a surgery, called a vasectomy, to tie off the tubes that carry sperm in men. This method takes 3 months to work. Other forms of birth control must be used for 3 months. Natural planning methods This means not having sex on the days the female partner could get pregnant. Here are some types of natural planning birth control: Using a calendar: To keep track of the length of each menstrual cycle. To  find out what days pregnancy can happen. To plan to not have sex on days when pregnancy can happen. Watching for signs of ovulation and not having sex during this time. The female partner can check for ovulation by keeping track of their temperature each day. They can also look for changes in the mucus that comes from the cervix. Where to find more information Centers for Disease Control and Prevention: tonerpromos.no. Then: Enter birth control in the search box. This information is not intended to replace advice given to you by your health care provider. Make sure you discuss any questions you have with your health care provider. Document Revised: 03/04/2024 Document Reviewed: 06/25/2022 Elsevier Patient  Education  2025 [employer].

## 2024-06-24 ENCOUNTER — Encounter: Admitting: Family Medicine

## 2024-07-01 ENCOUNTER — Encounter: Admitting: Family Medicine

## 2024-07-02 ENCOUNTER — Ambulatory Visit

## 2024-07-02 DIAGNOSIS — Z3493 Encounter for supervision of normal pregnancy, unspecified, third trimester: Secondary | ICD-10-CM

## 2024-07-08 ENCOUNTER — Encounter: Admitting: Family Medicine

## 2024-08-11 ENCOUNTER — Inpatient Hospital Stay (HOSPITAL_COMMUNITY): Admit: 2024-08-11
